# Patient Record
Sex: Female | Born: 1980 | Race: White | Hispanic: Yes | Marital: Married | Smoking: Never smoker
Health system: Southern US, Community
[De-identification: ages and names within clinical notes are randomized; demographics above are authoritative.]

## PROBLEM LIST (undated history)

## (undated) DIAGNOSIS — F99 Mental disorder, not otherwise specified: Secondary | ICD-10-CM

## (undated) DIAGNOSIS — I10 Essential (primary) hypertension: Secondary | ICD-10-CM

## (undated) DIAGNOSIS — D219 Benign neoplasm of connective and other soft tissue, unspecified: Secondary | ICD-10-CM

## (undated) HISTORY — DX: Essential (primary) hypertension: I10

## (undated) HISTORY — DX: Mental disorder, not otherwise specified: F99

## (undated) HISTORY — PX: NO PAST SURGERIES: SHX2092

---
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
## 2011-06-07 DIAGNOSIS — D219 Benign neoplasm of connective and other soft tissue, unspecified: Secondary | ICD-10-CM

## 2011-06-07 HISTORY — DX: Benign neoplasm of connective and other soft tissue, unspecified: D21.9

## 2015-07-30 DIAGNOSIS — E663 Overweight: Secondary | ICD-10-CM | POA: Insufficient documentation

## 2016-07-08 LAB — HM PAP SMEAR: HM Pap smear: NEGATIVE

## 2018-06-18 DIAGNOSIS — N926 Irregular menstruation, unspecified: Secondary | ICD-10-CM | POA: Insufficient documentation

## 2018-12-11 ENCOUNTER — Other Ambulatory Visit: Payer: Self-pay

## 2018-12-11 ENCOUNTER — Ambulatory Visit (LOCAL_COMMUNITY_HEALTH_CENTER): Payer: Self-pay

## 2018-12-11 VITALS — BP 128/80 | Ht 63.0 in | Wt 173.0 lb

## 2018-12-11 DIAGNOSIS — Z3041 Encounter for surveillance of contraceptive pills: Secondary | ICD-10-CM

## 2018-12-11 DIAGNOSIS — Z3009 Encounter for other general counseling and advice on contraception: Secondary | ICD-10-CM

## 2018-12-11 DIAGNOSIS — N926 Irregular menstruation, unspecified: Secondary | ICD-10-CM

## 2018-12-11 DIAGNOSIS — E663 Overweight: Secondary | ICD-10-CM

## 2018-12-11 DIAGNOSIS — Z30011 Encounter for initial prescription of contraceptive pills: Secondary | ICD-10-CM

## 2018-12-11 MED ORDER — NORGESTIMATE-ETH ESTRADIOL 0.25-35 MG-MCG PO TABS: 1.0000 | ORAL_TABLET | Freq: Every day | ORAL | 0 refills | Status: DC

## 2018-12-11 NOTE — Progress Notes (Signed)
   Subjective:    Patient ID: Madeline Grant, female    DOB: March 20, 1981, 38 y.o.   MRN: [id number]  HPI Client is here for restart on Sprintec. Patient's last menstrual period was 12/04/2018 (exact date).    Review of Systems  Constitutional: Negative.   Genitourinary: Negative for menstrual problem, pelvic pain, urgency, vaginal discharge and vaginal pain.  Skin: Negative for rash.  All other systems reviewed and are negative.      Objective:   Physical Exam  Not indicated     Assessment & Plan:  Sprintec take 1 tablet daily x 4 packs.  Co. To use condoms x 1 wk for back-up. Co to contact clinic for appt to complete RV and ocps refill

## 2018-12-11 NOTE — Progress Notes (Signed)
Providers orders completed 

## 2018-12-11 NOTE — Progress Notes (Signed)
Pt to [facility] for more OCP until we are doing physicals (due to Covid 19 precautions). No problems.

## 2019-05-16 ENCOUNTER — Other Ambulatory Visit: Payer: Self-pay

## 2019-05-16 ENCOUNTER — Ambulatory Visit (LOCAL_COMMUNITY_HEALTH_CENTER): Payer: Self-pay

## 2019-05-16 VITALS — BP 126/84 | Ht 63.0 in | Wt 172.0 lb

## 2019-05-16 DIAGNOSIS — Z3041 Encounter for surveillance of contraceptive pills: Secondary | ICD-10-CM

## 2019-05-16 DIAGNOSIS — Z3009 Encounter for other general counseling and advice on contraception: Secondary | ICD-10-CM

## 2019-05-16 MED ORDER — NORGESTIMATE-ETH ESTRADIOL 0.25-35 MG-MCG PO TABS: 1.0000 | ORAL_TABLET | Freq: Every day | ORAL | 0 refills | Status: DC

## 2019-05-16 NOTE — Progress Notes (Signed)
Last physical at [facility] was 09/04/2017. Provider RV in 12/2018.  Per [facility]: 07/12/2016 PAP was NIL & HPV negative (Needs Cotest 07/2021, Last CBE 07/2015). Consulted with provider regarding pt details and request to continue with OCP. Per verbal order by Donnal Moat, CNM, dispensed #2 packs of Sprintec, and pt to schedule physical when opening last pack of OCP ([facility] plans to resume physicals Jan 2021).

## 2019-07-08 ENCOUNTER — Other Ambulatory Visit: Payer: Self-pay | Admitting: Family Medicine

## 2019-07-08 DIAGNOSIS — Z3009 Encounter for other general counseling and advice on contraception: Secondary | ICD-10-CM

## 2019-07-08 MED ORDER — NORGESTIMATE-ETH ESTRADIOL 0.25-35 MG-MCG PO TABS: 1.0000 | ORAL_TABLET | Freq: Every day | ORAL | 0 refills | Status: DC

## 2019-07-08 NOTE — Progress Notes (Signed)
To decrease provider visits during Covid we are prescribing refills of birth control to be dispensed at nurse clinic visits. I will prescribe 1 year of OCP provided pt's BP is wnl, they have not missed any recent pills and they answer No to the following questions at RN clinic visit tomorrow:  Does pt meet any of the following contraindications to estrogen use? -Age ?35 years and smoking ?15 cigarettes per day -Migraine with aura (or migraine without aura + >35 yo) -Two or more RF for cardiovascular disease (such as older age, smoking, diabetes, and hypertension) -Hypertension -Breast cancer -Hx of blood clots or clotting disorders -Known ischemic heart disease -History of stroke -Complicated valvular heart disease (pulmonary HTN, risk for afib, hx subacute bacterial endocarditis) -Liver problems  RN: If answered No to all questions, BP is wnl and they have not missed any recent pills please dispense the pre-prescribed OCP. (If answered Yes please schedule with a provider.)  RN: Please counsel pt about signs and symptoms of DVT and PE as well as increased risk of blood clots in smokers using estrogen containing contraceptives.  RN: Please advise pt of the date to RTC when their next pap will be due.

## 2019-07-10 ENCOUNTER — Telehealth: Payer: Self-pay | Admitting: Family Medicine

## 2019-07-10 NOTE — Telephone Encounter (Signed)
patient missed appt and needs more pills she only has enough for this week

## 2019-07-11 ENCOUNTER — Telehealth: Payer: Self-pay | Admitting: Family Medicine

## 2019-07-12 NOTE — Telephone Encounter (Signed)
Attempted to return call via interpreter M. [place]-no answer, left voicemail message Debera Lat, RN

## 2019-07-15 NOTE — Telephone Encounter (Signed)
ATC with agency interpreter M. [place] No answer, LMTC. Hal Morales, RN

## 2019-07-17 NOTE — Telephone Encounter (Signed)
Pt states she needs appt with provider for birth control pills, needs appt ASAP. Pt scheduled with provider on 07/18/2019.

## 2019-07-18 ENCOUNTER — Other Ambulatory Visit: Payer: Self-pay

## 2019-07-18 ENCOUNTER — Ambulatory Visit (LOCAL_COMMUNITY_HEALTH_CENTER): Payer: Self-pay | Admitting: Family Medicine

## 2019-07-18 ENCOUNTER — Encounter: Payer: Self-pay | Admitting: Family Medicine

## 2019-07-18 VITALS — BP 121/76 | Wt 172.2 lb

## 2019-07-18 DIAGNOSIS — Z3009 Encounter for other general counseling and advice on contraception: Secondary | ICD-10-CM

## 2019-07-18 DIAGNOSIS — Z3041 Encounter for surveillance of contraceptive pills: Secondary | ICD-10-CM

## 2019-07-18 MED ORDER — NORGESTIMATE-ETH ESTRADIOL 0.25-35 MG-MCG PO TABS: 1.0000 | ORAL_TABLET | Freq: Every day | ORAL | 0 refills | Status: DC

## 2019-07-18 NOTE — Progress Notes (Signed)
Patient given only 8 packs Sprintec due to expiration dates. Patient counseled to call for appointment to get more OC when she opens pack #8. Patient states understanding.Jenetta Downer, RN

## 2019-07-18 NOTE — Progress Notes (Addendum)
Patient here for more OC, taking Sprintec. Patient declined interpreter. Was told she needs PE to get more OC. Last PE 09/2017, provider RV in 12/2018. Last Pap per [facility] was 07/22/2016, NIL and HPV negative, next pap due 2023. Given 4 packs Sprintec in 12/2018, and 2 more packs in 05/2019. Needs more, finishes last pack this week. Jenetta Downer, RN

## 2019-07-18 NOTE — Progress Notes (Signed)
Contraception/Family Planning VISIT ENCOUNTER NOTE  Subjective:   Madeline Grant is a 39 y.o. No obstetric history on file. female here for reproductive life counseling.  Desires reliability from [facility].  Likes her current method and it works well for her. No changes in her medical history. Denies clots, liver disease in the alst year. Reports she does not want a pregnancy in the next year. Denies abnormal vaginal bleeding, discharge, pelvic pain, problems with intercourse or other gynecologic concerns.    Gynecologic History Patient's last menstrual period was 07/11/2019 (exact date). Contraception: OCP (estrogen/progesterone)  Health Maintenance Due  Topic Date Due  . HIV Screening  06/21/1994  . TETANUS/TDAP  06/21/1998  . INFLUENZA VACCINE  01/05/2019     The following portions of the patient's history were reviewed and updated as appropriate: allergies, current medications, past family history, past medical history, past social history, past surgical history and problem list.  Review of Systems Pertinent items are noted in HPI.    Objective:  BP 121/76   Wt 172 lb 3.2 oz (78.1 kg)   LMP 07/11/2019 (Exact Date)   BMI 30.50 kg/m  Gen: well appearing, NAD HEENT: no scleral icterus CV: RR Lung: Normal WOB Ext: warm well perfused  PELVIC: not indicated. Pap is up to date (2018)   Assessment and Plan:   Contraception counseling: Reviewed all forms of birth control options in the tiered based approach. available including abstinence; over the counter/barrier methods; hormonal contraceptive medication including pill, patch, ring, injection,contraceptive implant; hormonal and nonhormonal IUDs; permanent sterilization options including vasectomy and the various tubal sterilization modalities. Risks, benefits, and typical effectiveness rates were reviewed.  Questions were answered.  Written information was also given to the patient to review.  Patient desires OCP, this  was prescribed for patient. She will follow up in  1 year for surveillance.  She was told to call with any further questions, or with any concerns about this method of contraception.  Emphasized use of condoms 100% of the time for STI prevention.   1. Family planning  2. Encounter for surveillance of contraceptive pills Dispense Sprintec x 1 year supply Declines STI screening No breast concerns and will follow up with BCCCP for screening mammogram. Handout given to her today .   Please refer to After Visit Summary for other counseling recommendations.   Return in about 1 year (around 07/17/2020) for Yearly wellness exam.  Caren Macadam, MD [place]

## 2020-02-28 ENCOUNTER — Ambulatory Visit: Payer: Self-pay

## 2020-03-04 ENCOUNTER — Ambulatory Visit (LOCAL_COMMUNITY_HEALTH_CENTER): Payer: Self-pay | Admitting: Advanced Practice Midwife

## 2020-03-04 ENCOUNTER — Other Ambulatory Visit: Payer: Self-pay

## 2020-03-04 VITALS — BP 115/74 | Ht 63.0 in | Wt 171.2 lb

## 2020-03-04 DIAGNOSIS — E669 Obesity, unspecified: Secondary | ICD-10-CM | POA: Insufficient documentation

## 2020-03-04 DIAGNOSIS — Z3009 Encounter for other general counseling and advice on contraception: Secondary | ICD-10-CM

## 2020-03-04 DIAGNOSIS — Z3041 Encounter for surveillance of contraceptive pills: Secondary | ICD-10-CM

## 2020-03-04 MED ORDER — NORGESTIMATE-ETH ESTRADIOL 0.25-35 MG-MCG PO TABS: 1.0000 | ORAL_TABLET | Freq: Every day | ORAL | 8 refills | Status: DC

## 2020-03-04 NOTE — Progress Notes (Signed)
Provider ordered #13 packs of OCP's. RN only able to dispense #5 packs due to expiration date of 08/03/2020. Explained to patient to call for an appt to come in and pick up remainder of pills when she starts last pack. Patient verbalized understanding of same. Hal Morales, RN

## 2020-03-04 NOTE — Progress Notes (Signed)
Here today for PE and birth control pill refill. Last PE here was 09/2017, last Pap Smear 07/22/2016, last CBE 07/2015. Declines all STD screening today. Hal Morales, RN

## 2020-03-04 NOTE — Progress Notes (Signed)
[facility] [place] Number: [phone]    Family Planning Visit- Initial Visit  Subjective:  Shanikia Kernodle is a 39 y.o. MHF nonsmoker G3P2103 (15, 21, 23)   being seen today for an initial well woman visit and to discuss family planning options.  She is currently using Combination OCPs for pregnancy prevention. Patient reports she does not want a pregnancy in the next year.  Patient has the following medical conditions has Irregular menstrual bleeding and Obesity BMI=30.3 on their problem list.  Chief Complaint  Patient presents with  . Annual Exam  . Contraception    Patient reports LMP 02/20/20.  Last sex 03/03/20 without condom; with current partner x 24 years.  Took last ocp yesterday; takes >1 hour late 1-2x/mo.  Last ETOH 02/22/20 (1 beer) 1x/mo.  Has never had a mammogram.  Living with husband and 3 kids. Works 37 hrs/wk in a [place]. Last PE 09/2017.  Last CBE 07/2015.  Last pap 07/22/16 neg HPV neg.  Patient denies cigs, MJ  Body mass index is 30.33 kg/m. - Patient is eligible for diabetes screening based on BMI and age >89?  yes HA1C ordered? no  Patient reports 1 of partners in last year. Desires STI screening?  No - refuses  Has patient been screened once for HCV in the past?  No  No results found for: HCVAB  Does the patient have current drug use (including MJ), have a partner with drug use, and/or has been incarcerated since last result? No  If yes-- Screen for HCV through [facility]   Does the patient meet criteria for HBV testing? No  Criteria:  -Household, sexual or needle sharing contact with HBV -History of drug use -HIV positive -Those with known Hep C   Health Maintenance Due  Topic Date Due  . Hepatitis C Screening  Never done  . COVID-19 Vaccine (1) Never done  . HIV Screening  Never done  . TETANUS/TDAP  Never done  . INFLUENZA VACCINE  Never done    Review  of Systems  All other systems reviewed and are negative.   The following portions of the patient's history were reviewed and updated as appropriate: allergies, current medications, past family history, past medical history, past social history, past surgical history and problem list. Problem list updated.   See flowsheet for other program required questions.  Objective:   Vitals:   03/04/20 1607  BP: 115/74  Weight: 171 lb 3.2 oz (77.7 kg)  Height: 5\' 3"  (1.6 m)    Physical Exam Constitutional:      Appearance: Normal appearance. She is obese.  HENT:     Head: Normocephalic and atraumatic.     Mouth/Throat:     Mouth: Mucous membranes are moist.  Eyes:     Conjunctiva/sclera: Conjunctivae normal.  Cardiovascular:     Rate and Rhythm: Normal rate and regular rhythm.  Pulmonary:     Effort: Pulmonary effort is normal.     Breath sounds: Normal breath sounds.  Chest:     Breasts:        Right: Normal.        Left: Normal.  Abdominal:     Palpations: Abdomen is soft.     Comments: Soft without masses or tenderness, poor tone  Genitourinary:    Comments: Pt refuses exam.  Pap not due 07/2021, monogomous relationship, declines sx vaginitis Musculoskeletal:  General: Normal range of motion.     Cervical back: Normal range of motion and neck supple.  Skin:    General: Skin is warm and dry.  Neurological:     Mental Status: She is alert.  Psychiatric:        Mood and Affect: Mood normal.       Assessment and Plan:  Celedonio Miyamoto Nisreen Guise is a 39 y.o. female presenting to the [facility] Department for an initial well woman exam/family planning visit  Contraception counseling: Reviewed all forms of birth control options in the tiered based approach. available including abstinence; over the counter/barrier methods; hormonal contraceptive medication including pill, patch, ring, injection,contraceptive implant, ECP; hormonal and nonhormonal IUDs;  permanent sterilization options including vasectomy and the various tubal sterilization modalities. Risks, benefits, and typical effectiveness rates were reviewed.  Questions were answered.  Written information was also given to the patient to review.  Patient desires ocp's, this was prescribed for patient. She will follow up in 1 year for surveillance.  She was told to call with any further questions, or with any concerns about this method of contraception.  Emphasized use of condoms 100% of the time for STI prevention.  Patient was offered ECP. ECP was not accepted by the patient. ECP counseling was not given - see RN documentation  1. Obesity, unspecified classification, unspecified obesity type, unspecified whether serious comorbidity present   2. Family planning Please give BCCCP # to pt so she can schedule yearly mammograms  3. Encounter birth control May have [place] #13 I po daily to begin when completes current pack l     Return in about 1 year (around 03/04/2021).  No future appointments.  Herbie Saxon, CNM

## 2020-07-16 ENCOUNTER — Other Ambulatory Visit: Payer: Self-pay

## 2020-07-16 ENCOUNTER — Ambulatory Visit (LOCAL_COMMUNITY_HEALTH_CENTER): Payer: Self-pay

## 2020-07-16 VITALS — BP 112/73 | Ht 63.0 in | Wt 166.0 lb

## 2020-07-16 DIAGNOSIS — Z3041 Encounter for surveillance of contraceptive pills: Secondary | ICD-10-CM

## 2020-07-16 DIAGNOSIS — Z3009 Encounter for other general counseling and advice on contraception: Secondary | ICD-10-CM

## 2020-07-16 MED ORDER — NORGESTIMATE-ETH ESTRADIOL 0.25-35 MG-MCG PO TABS: 1.0000 | ORAL_TABLET | Freq: Every day | ORAL | 0 refills | Status: DC

## 2020-07-16 NOTE — Progress Notes (Signed)
On last pack of Sprintec. Denies missing any pills and takes daily at same time. Due for Sprintec #8 packs today.  Sprintec #8 packs dispensed today to complete order by Ola Spurr, CNM dated 03/04/2020. Pt to call [facility] for appt when begins last pack of Sprintec. This pack has been labeled with a reminder. Questions answered and reports understanding. [employer], interpreter today. Josie Saunders, RN

## 2021-02-09 ENCOUNTER — Ambulatory Visit (LOCAL_COMMUNITY_HEALTH_CENTER): Payer: Self-pay

## 2021-02-09 ENCOUNTER — Other Ambulatory Visit: Payer: Self-pay

## 2021-02-09 VITALS — BP 135/79 | Ht 63.0 in | Wt 166.5 lb

## 2021-02-09 DIAGNOSIS — Z3041 Encounter for surveillance of contraceptive pills: Secondary | ICD-10-CM

## 2021-02-09 DIAGNOSIS — Z3009 Encounter for other general counseling and advice on contraception: Secondary | ICD-10-CM

## 2021-02-09 MED ORDER — NORGESTIMATE-ETH ESTRADIOL 0.25-35 MG-MCG PO TABS: 1.0000 | ORAL_TABLET | Freq: Every day | ORAL | 0 refills | Status: DC

## 2021-02-09 NOTE — Progress Notes (Signed)
Consulted by RN re: patient situation.  Reviewed RN note and agree that it reflects our discussion and my recommendations.

## 2021-02-09 NOTE — Progress Notes (Signed)
In Nurse Clinic requesting ocp refill. Pt explains she thought she was having physical today. Pt was scheduled for nurse visit today and no annual physical appts are available. Pt states she is on last week of last pack of Sprintec. Denies missing any pills and takes at same time daily. [place], PA who orders Sprintec #1 pack with instructions to take one pill daily by mouth at same time each day. Sprintec #1 pack dispensed by RN with instructions explained. RN walked pt to clerk and annual PE scheduled for 03/08/2021 at 4 pm. M [place]), interpreter. Josie Saunders, RN

## 2021-03-08 ENCOUNTER — Encounter: Payer: Self-pay | Admitting: Advanced Practice Midwife

## 2021-03-08 ENCOUNTER — Ambulatory Visit (LOCAL_COMMUNITY_HEALTH_CENTER): Payer: Self-pay | Admitting: Advanced Practice Midwife

## 2021-03-08 ENCOUNTER — Other Ambulatory Visit: Payer: Self-pay

## 2021-03-08 VITALS — BP 130/84 | HR 85 | Ht 63.0 in | Wt 168.4 lb

## 2021-03-08 DIAGNOSIS — Z3041 Encounter for surveillance of contraceptive pills: Secondary | ICD-10-CM

## 2021-03-08 DIAGNOSIS — Z3009 Encounter for other general counseling and advice on contraception: Secondary | ICD-10-CM

## 2021-03-08 LAB — WET PREP FOR TRICH, YEAST, CLUE
Trichomonas Exam: NEGATIVE
Yeast Exam: NEGATIVE

## 2021-03-08 MED ORDER — NORETHINDRONE 0.35 MG PO TABS: 1.0000 | ORAL_TABLET | Freq: Every day | ORAL | 13 refills | Status: DC

## 2021-03-08 NOTE — Progress Notes (Signed)
[facility] [place] Number: [phone]    Family Planning Visit- Initial Visit  Subjective:  Madeline Grant is a 40 y.o. MHF nonsmoker G3P2103 (24, 22, 16)  being seen today for an initial annual visit and to discuss contraceptive options.  The patient is currently using Combination OCPs for pregnancy prevention. Patient reports she does not want a pregnancy in the next year.  Patient has the following medical conditions has Irregular menstrual bleeding and Obesity BMI=30.3 on their problem list.  Chief Complaint  Patient presents with   Annual Exam    Patient reports here for yearly physical but c/o h/a onset 3 wks ago and wants to try a different kind of pills. H/a 3-4x/wk usually on forehead or right side of forehead sl relieved with Tylenol but returns. Has apt with [facility] on 03/25/21 to discuss. LMP 02/25/21. Last sex 03/06/21 without condom; with current partner x 25 years; 1 sex partner in last 3 mo. Last ocp last night and compliant (Sprintec). Last dental exam 2021. Living with husband and 2 children. Working 40 hrs/wk. Has not had mammogram even though was given BCCCP # last year. Denies cigs. Last ETOH 2021  Patient denies cigs  Body mass index is 29.83 kg/m. - Patient is eligible for diabetes screening based on BMI and age >55?  yes HA1C ordered? Pt declines bloodwork  Patient reports 1  partner/s in last year. Desires STI screening?  No - declines  Has patient been screened once for HCV in the past?  No  No results found for: HCVAB  Does the patient have current drug use (including MJ), have a partner with drug use, and/or has been incarcerated since last result? No  If yes-- Screen for HCV through [facility]   Does the patient meet criteria for HBV testing? No  Criteria:  -Household, sexual or needle sharing contact with HBV -History of drug use -HIV positive -Those  with known Hep C   Health Maintenance Due  Topic Date Due   COVID-19 Vaccine (1) Never done   HIV Screening  Never done   Hepatitis C Screening  Never done   TETANUS/TDAP  Never done   INFLUENZA VACCINE  Never done    Review of Systems  Neurological:  Positive for headaches (onset 3 wks ago sl relieved with Tylenol but returns always on right side of forehead; has apt [facility]/25/[facility] with [facility]).  All other systems reviewed and are negative.  The following portions of the patient's history were reviewed and updated as appropriate: allergies, current medications, past family history, past medical history, past social history, past surgical history and problem list. Problem list updated.   See flowsheet for other program required questions.  Objective:   Vitals:   03/08/21 1619  BP: 130/84  Pulse: 85  Weight: 168 lb 6.4 oz (76.4 kg)  Height: 5\' 3"  (1.6 m)    Physical Exam Constitutional:      Appearance: Normal appearance. She is obese.  HENT:     Head: Normocephalic and atraumatic.     Mouth/Throat:     Mouth: Mucous membranes are moist.     Comments: Last dental exam 2021 Eyes:     Conjunctiva/sclera: Conjunctivae normal.  Neck:     Thyroid: No thyroid mass, thyromegaly or thyroid tenderness.  Cardiovascular:     Rate and Rhythm: Normal rate and regular rhythm.  Pulmonary:     Effort:  Pulmonary effort is normal.     Breath sounds: Normal breath sounds.  Chest:  Breasts:    Right: Normal.     Left: Normal.     Comments: C/o internal itching around right areola x 2 wks; normal exam Abdominal:     Palpations: Abdomen is soft.     Comments: Soft without masses or tenderness  Genitourinary:    General: Normal vulva.     Exam position: Lithotomy position.     Vagina: Vaginal discharge (white creamy leukorrhea, ph<4.5) present.     Cervix: Normal.     Uterus: Normal.      Adnexa: Right adnexa normal and left adnexa normal.     Rectum: Normal.   Musculoskeletal:        General: Normal range of motion.     Cervical back: Normal range of motion and neck supple.  Skin:    General: Skin is warm and dry.  Neurological:     Mental Status: She is alert.  Psychiatric:        Mood and Affect: Mood normal.      Assessment and Plan:  Madeline Grant is a 40 y.o. female presenting to the [facility] Department for an initial annual wellness/contraceptive visit  Contraception counseling: Reviewed all forms of birth control options in the tiered based approach. available including abstinence; over the counter/barrier methods; hormonal contraceptive medication including pill, patch, ring, injection,contraceptive implant, ECP; hormonal and nonhormonal IUDs; permanent sterilization options including vasectomy and the various tubal sterilization modalities. Risks, benefits, and typical effectiveness rates were reviewed.  Questions were answered.  Written information was also given to the patient to review.  Patient desires change in ocp's, this was prescribed for patient. She will follow up in  1 year for surveillance.  She was told to call with any further questions, or with any concerns about this method of contraception.  Emphasized use of condoms 100% of the time for STI prevention.  Patient was offered ECP. ECP was not accepted by the patient. ECP counseling was not given - see RN documentation  1. Family planning Treat wet mount per standing orders Immunization nurse consult Pt to schedule mammogram apt - WET PREP FOR TRICH, YEAST, CLUE - norethindrone (MICRONOR) 0.35 MG tablet; Take 1 tablet (0.35 mg total) by mouth daily.  Dispense: 28 tablet; Refill: 13  2. Encounter for surveillance of contraceptive pills Micronor #13 I po daily to begin when completes current pack in 1 week     No follow-ups on file.  No future appointments.  Herbie Saxon, CNM

## 2021-03-08 NOTE — Progress Notes (Signed)
Wet mount reviewed - negative. Rich Number, RN

## 2021-06-06 DIAGNOSIS — N63 Unspecified lump in unspecified breast: Secondary | ICD-10-CM

## 2021-06-06 HISTORY — DX: Unspecified lump in unspecified breast: N63.0

## 2021-10-21 ENCOUNTER — Other Ambulatory Visit: Payer: Self-pay

## 2021-10-21 DIAGNOSIS — Z1231 Encounter for screening mammogram for malignant neoplasm of breast: Secondary | ICD-10-CM

## 2021-11-02 ENCOUNTER — Ambulatory Visit: Payer: Self-pay | Attending: Hematology and Oncology | Admitting: *Deleted

## 2021-11-02 ENCOUNTER — Encounter: Payer: Self-pay | Admitting: *Deleted

## 2021-11-02 ENCOUNTER — Ambulatory Visit
Admission: RE | Admit: 2021-11-02 | Discharge: 2021-11-02 | Disposition: A | Discharge status: Home / Self Care | Payer: Self-pay | Source: Ambulatory Visit | Attending: Obstetrics and Gynecology | Admitting: Obstetrics and Gynecology

## 2021-11-02 ENCOUNTER — Encounter (INDEPENDENT_AMBULATORY_CARE_PROVIDER_SITE_OTHER): Payer: Self-pay

## 2021-11-02 VITALS — BP 120/78 | HR 97 | Resp 18 | Wt 175.0 lb

## 2021-11-02 DIAGNOSIS — Z1231 Encounter for screening mammogram for malignant neoplasm of breast: Secondary | ICD-10-CM | POA: Insufficient documentation

## 2021-11-02 DIAGNOSIS — Z01419 Encounter for gynecological examination (general) (routine) without abnormal findings: Secondary | ICD-10-CM

## 2021-11-02 NOTE — Progress Notes (Addendum)
[name] Madeline Grant is a 41 y.o. female who presents to [facility] clinic today with no complaints. Patient presents for her well woman visit.    Pap Smear: Pap not smear completed today per patient request. Offered to complete her pap smear, but patient states she has an appointment with the health department and will discuss with them at her next visit.  Last Pap smear was on 06/21/16 at [facility] clinic and was normal per patient. Per patient has no history of an abnormal Pap smear. Last Pap smear result is not available in Epic.   Physical exam: Breasts Breasts symmetrical. No skin abnormalities bilateral breasts. There is an accessory nipple on the right breast.  No nipple retraction bilateral breasts. No nipple discharge bilateral breasts. No lymphadenopathy. No lumps palpated bilateral breasts.       Pelvic/Bimanual Pap is indicated today but patient refused.  She is to follow up with the [facility] for her pap.   Smoking History: Patient has never smoked.    Patient Navigation: Patient education provided. Access to services provided for patient through [place] [facility] interpreter provided interpretation.   No transportation provided   Colorectal Cancer Screening: Per patient has never had colonoscopy completed No complaints today.    Breast and Cervical Cancer Risk Assessment: Patient does not have family history of breast cancer, known genetic mutations, or radiation treatment to the chest before age 74. Patient does not have history of cervical dysplasia, immunocompromised, or DES exposure in-utero.  Risk Assessment     Risk Scores       11/02/2021   Last edited by: Drue Dun, RN   5-year risk: 0.3 %   Lifetime risk: 5.1 %            A: BCCCP exam without pap smear  P: Referred patient to the [facility] for a screening mammogram. Appointment scheduled for today.  Rico Junker,  RN 11/02/2021 1:20 PM

## 2021-11-04 ENCOUNTER — Other Ambulatory Visit: Payer: Self-pay | Admitting: Obstetrics and Gynecology

## 2021-11-04 DIAGNOSIS — N63 Unspecified lump in unspecified breast: Secondary | ICD-10-CM

## 2021-11-04 DIAGNOSIS — N6320 Unspecified lump in the left breast, unspecified quadrant: Secondary | ICD-10-CM

## 2021-11-04 DIAGNOSIS — R928 Other abnormal and inconclusive findings on diagnostic imaging of breast: Secondary | ICD-10-CM

## 2021-11-24 ENCOUNTER — Ambulatory Visit
Admission: RE | Admit: 2021-11-24 | Discharge: 2021-11-24 | Disposition: A | Discharge status: Home / Self Care | Payer: Self-pay | Source: Ambulatory Visit | Attending: Obstetrics and Gynecology | Admitting: Obstetrics and Gynecology

## 2021-11-24 DIAGNOSIS — R928 Other abnormal and inconclusive findings on diagnostic imaging of breast: Secondary | ICD-10-CM

## 2021-11-24 DIAGNOSIS — N63 Unspecified lump in unspecified breast: Secondary | ICD-10-CM

## 2022-02-08 ENCOUNTER — Ambulatory Visit (LOCAL_COMMUNITY_HEALTH_CENTER): Payer: Self-pay

## 2022-02-08 VITALS — BP 128/66 | Ht 63.0 in | Wt 176.5 lb

## 2022-02-08 DIAGNOSIS — Z3009 Encounter for other general counseling and advice on contraception: Secondary | ICD-10-CM

## 2022-02-08 DIAGNOSIS — Z3041 Encounter for surveillance of contraceptive pills: Secondary | ICD-10-CM

## 2022-02-08 MED ORDER — NORETHINDRONE 0.35 MG PO TABS: 1.0000 | ORAL_TABLET | Freq: Every day | ORAL | 0 refills | Status: DC

## 2022-02-08 NOTE — Progress Notes (Addendum)
In nurse clinic for OC refill.  States this is her last pack of pills and on third week of pills.  Denies any problems and desires to continue.  Last annual exam 03/08/21 and pt reminded she needs to schedule PE for next appt; appt reminder given.    Consulted Dr. Lubertha Sayres and okay to dispense 3 packs of pills. The patient was dispensed Micronor (Norethindrone 0.'35mg'$ ) #3 packs today. I provided counseling today regarding the medication. We discussed the medication, the side effects and when to call clinic. Instructed to take pill daily at same time.  Patient given the opportunity to ask questions. Questions answered.     Jamas Lav [place] provided interpreter services.  Tonny [place], RN

## 2022-05-02 ENCOUNTER — Encounter: Payer: Self-pay | Admitting: Nurse Practitioner

## 2022-05-02 ENCOUNTER — Ambulatory Visit (LOCAL_COMMUNITY_HEALTH_CENTER): Payer: Self-pay | Admitting: Nurse Practitioner

## 2022-05-02 VITALS — BP 139/88 | Ht 63.0 in | Wt 174.0 lb

## 2022-05-02 DIAGNOSIS — Z01419 Encounter for gynecological examination (general) (routine) without abnormal findings: Secondary | ICD-10-CM

## 2022-05-02 DIAGNOSIS — Z3009 Encounter for other general counseling and advice on contraception: Secondary | ICD-10-CM

## 2022-05-02 MED ORDER — NORETHINDRONE 0.35 MG PO TABS: 1.0000 | ORAL_TABLET | Freq: Every day | ORAL | 12 refills | Status: DC

## 2022-05-02 NOTE — Progress Notes (Unsigned)
Pt here for annual physical and birth control.  No in-house labs performed.  Condoms declined.-Forest Becker, RN

## 2022-05-02 NOTE — Progress Notes (Signed)
[facility] Number: [phone]    Family Planning Visit- Initial Visit  Subjective:  Madeline Grant is a 41 y.o.  [phone]   being seen today for an initial annual visit and to discuss reproductive life planning.  The patient is currently using {Upstream End Methods:24109} for pregnancy prevention. Patient reports   {DOES_DOES GYI:94854} want a pregnancy in the next year.     report they are looking for a method that provides {Contraception Wants:27264}  Patient has the following medical conditions has Irregular menstrual bleeding and Obesity BMI=30.3 on their problem list.  Chief Complaint  Patient presents with   Contraception    Physical and birth control pills     Patient reports***  Patient denies ***   Body mass index is 30.82 kg/m. - Patient is eligible for diabetes screening based on BMI and age >58?  {YES/NO/NOT APPLICABLE:20182} OE7O ordered? {YES/NO/NOT APPLICABLE:20182}  Patient reports {NUMBER 1-10:22536}  partner/s in last year. Desires STI screening?  {Yes or If no, why not?:20788}  Has patient been screened once for HCV in the past?  {yes/no:20286}  No results found for: "HCVAB"  Does the patient have current drug use (including MJ), have a partner with drug use, and/or has been incarcerated since last result? {yes/no:20286}  If yes-- Screen for HCV through [facility]   Does the patient meet criteria for HBV testing? {yes/no:20286}  Criteria:  -Household, sexual or needle sharing contact with HBV -History of drug use -HIV positive -Those with known Hep C   Health Maintenance Due  Topic Date Due   COVID-19 Vaccine (1) Never done   HIV Screening  Never done   Hepatitis C Screening  Never done   PAP SMEAR-Modifier  07/08/2021   INFLUENZA VACCINE  Never done    ROS  The following portions of the patient's history were reviewed and updated as  appropriate: allergies, current medications, past family history, past medical history, past social history, past surgical history and problem list. Problem list updated.   See flowsheet for other program required questions.  Objective:   Vitals:   05/02/22 1525  BP: 139/88  Weight: 174 lb (78.9 kg)  Height: '5\' 3"'$  (1.6 m)    Physical Exam    Assessment and Plan:  Madeline Grant is a 41 y.o. female presenting to the [facility] Department for an initial annual wellness/contraceptive visit  Contraception counseling: Reviewed options based on patient desire and reproductive life plan. Patient is interested in {Upstream End Methods:24109}. This {WAS/WAS NOT:[phone]::"was not"} provided to the patient today. *** if not why not clearly documented  Risks, benefits, and typical effectiveness rates were reviewed.  Questions were answered.  Written information was also given to the patient to review.    The patient will follow up in  {NUMBER 1-10:22536} {days/wks/mos/yrs:310907} for surveillance.  The patient was told to call with any further questions, or with any concerns about this method of contraception.  Emphasized use of condoms 100% of the time for STI prevention.  Need for ECP was assessed. Patient reported {unprotected sex categories:26659}.  Reviewed options and patient desired {ECP options:27263}   1. Family planning counseling *** - Hemoglobin A1C - norethindrone (MICRONOR) 0.35 MG tablet; Take 1 tablet (0.35 mg total) by mouth daily.  Dispense: 28 tablet; Refill: 12  2. Well woman exam with routine gynecological exam ***     No follow-ups on file.  No future appointments.  Gregary Cromer, FNP

## 2022-05-03 ENCOUNTER — Encounter: Payer: Self-pay | Admitting: Nurse Practitioner

## 2022-05-03 LAB — HEMOGLOBIN A1C
Est. average glucose Bld gHb Est-mCnc: 120 mg/dL
Hgb A1c MFr Bld: 5.8 % — ABNORMAL HIGH (ref 4.8–5.6)

## 2022-05-04 LAB — IGP, APTIMA HPV
HPV Aptima: NEGATIVE
PAP Smear Comment: 0

## 2022-05-05 ENCOUNTER — Other Ambulatory Visit: Payer: Self-pay | Admitting: *Deleted

## 2022-05-05 DIAGNOSIS — R928 Other abnormal and inconclusive findings on diagnostic imaging of breast: Secondary | ICD-10-CM

## 2022-05-05 NOTE — Addendum Note (Signed)
Addended by: Demetrius Revel on: 05/05/2022 04:23 PM   Modules accepted: Orders

## 2022-05-05 NOTE — Addendum Note (Signed)
Addended by: Demetrius Revel on: 05/05/2022 04:25 PM   Modules accepted: Orders

## 2022-06-09 ENCOUNTER — Ambulatory Visit
Admission: RE | Admit: 2022-06-09 | Discharge: 2022-06-09 | Disposition: A | Discharge status: Home / Self Care | Payer: Self-pay | Source: Ambulatory Visit | Attending: Obstetrics and Gynecology | Admitting: Obstetrics and Gynecology

## 2022-06-09 DIAGNOSIS — R928 Other abnormal and inconclusive findings on diagnostic imaging of breast: Secondary | ICD-10-CM

## 2022-06-10 ENCOUNTER — Other Ambulatory Visit: Payer: Self-pay | Admitting: Obstetrics and Gynecology

## 2022-06-10 DIAGNOSIS — R928 Other abnormal and inconclusive findings on diagnostic imaging of breast: Secondary | ICD-10-CM

## 2022-07-21 ENCOUNTER — Other Ambulatory Visit: Payer: Self-pay

## 2022-07-21 DIAGNOSIS — N6321 Unspecified lump in the left breast, upper outer quadrant: Secondary | ICD-10-CM

## 2022-12-12 ENCOUNTER — Telehealth: Payer: Self-pay

## 2022-12-12 ENCOUNTER — Ambulatory Visit: Payer: Self-pay

## 2022-12-12 NOTE — Telephone Encounter (Signed)
Using [employer], [place], [place]: [id number], I have called and spoke with the pt regarding her missed appointment. Pt states she was not aware of her appt and that she cancelled her mammogram. I have provided the pt with our contact number to reschedule her appointments and have also advised the scheduler as well.

## 2022-12-14 ENCOUNTER — Other Ambulatory Visit: Payer: Self-pay

## 2022-12-20 ENCOUNTER — Ambulatory Visit: Payer: Self-pay | Attending: Hematology and Oncology | Admitting: *Deleted

## 2022-12-20 VITALS — BP 141/88 | Wt 175.0 lb

## 2022-12-20 DIAGNOSIS — Z1239 Encounter for other screening for malignant neoplasm of breast: Secondary | ICD-10-CM

## 2022-12-20 NOTE — Patient Instructions (Signed)
Explained breast self awareness with Madeline Grant. Patient did not need a Pap smear today due to last Pap smear and HPV typing was 05/02/2022. Let her know BCCCP will cover Pap smears and HPV typing every 5 years unless has a history of abnormal Pap smears. Referred patient to the [facility] for a diagnostic mammogram per recommendation. Appointment scheduled Monday, January 30, 2023 at 1520. Patient aware of appointment and will be there. Madeline Grant verbalized understanding.  [name], [name], RN 1:03 PM

## 2022-12-20 NOTE — Progress Notes (Signed)
[name] is a 42 y.o. female who presents to [facility] clinic today with no complaints. Patient had a left breast diagnostic mammogram and ultrasound completed 06/09/2022 that a bilateral diagnostic mammogram and left breast ultrasound was recommended in 6 months for a stable benign appearing left breast mass.    Pap Smear: Pap smear not completed today. Last Pap smear was 05/02/2022 at the [facility] Department clinic and was normal with negative HPV. Per patient has no history of an abnormal Pap smear. Last Pap smear result is available in Epic.   Physical exam: Breasts Breasts symmetrical. No skin abnormalities bilateral breasts. No nipple retraction bilateral breasts. No nipple discharge bilateral breasts. No lymphadenopathy. No lumps palpated bilateral breasts. No complaints of pain or tenderness on exam.      MS DIGITAL DIAG TOMO UNI LEFT  Result Date: 06/09/2022 CLINICAL DATA:  Follow-up of probably benign left breast masses. EXAM: DIGITAL DIAGNOSTIC UNILATERAL LEFT MAMMOGRAM WITH TOMOSYNTHESIS; ULTRASOUND LEFT BREAST LIMITED TECHNIQUE: Left digital diagnostic mammography and breast tomosynthesis was performed.; Targeted ultrasound examination of the left breast was performed. COMPARISON:  Previous exam(s). ACR Breast Density Category c: The breast tissue is heterogeneously dense, which may obscure small masses. FINDINGS: Mammographically, there are no new suspicious masses, areas of architectural distortion or microcalcifications in the left breast. Stable focal asymmetry in the left breast upper outer quadrant, middle depth. Targeted left breast ultrasound is performed and demonstrates stable hypoechoic circumscribed benign-appearing mass at 2:30 o'clock 3 cm from the nipple measuring 1.1 x 0.3 x 1.1 cm. There is an adjacent benign-appearing hypoechoic circumscribed mass measuring 0.7 x 0.2 x 0.9 cm. In the left breast 2:30 o'clock 3 cm from nipple there is a benign cyst  measuring 0.5 x 0.7 x 0.5 cm. IMPRESSION: Stable benign-appearing left breast masses. RECOMMENDATION: Bilateral diagnostic mammogram and focused left breast ultrasound in 6 months. I have discussed the findings and recommendations with the patient. If applicable, a reminder letter will be sent to the patient regarding the next appointment. BI-RADS CATEGORY  3: Probably benign. Electronically Signed   By: Ted Mcalpine M.D.   On: 06/09/2022 16:13  MS DIGITAL DIAG TOMO UNI LEFT  Result Date: 11/24/2021 CLINICAL DATA:  Callback for LEFT breast masses from baseline mammogram. EXAM: DIGITAL DIAGNOSTIC UNILATERAL LEFT MAMMOGRAM WITH TOMOSYNTHESIS AND CAD; ULTRASOUND LEFT BREAST LIMITED TECHNIQUE: Left digital diagnostic mammography and breast tomosynthesis was performed. The images were evaluated with computer-aided detection.; Targeted ultrasound examination of the left breast was performed. COMPARISON:  Previous exam(s). ACR Breast Density Category c: The breast tissue is heterogeneously dense, which may obscure small masses. FINDINGS: Spot compression tomosynthesis views demonstrate persistence of several oval circumscribed masses in the LEFT outer breast at anterior to middle depth. On physical exam, no suspicious mass is appreciated. Targeted ultrasound was performed of the LEFT outer breast. At 3 o'clock 3 cm from the nipple, there is an oval circumscribed anechoic mass with posterior acoustic enhancement. It measures 7 x 7 x 8 mm and is consistent with a benign simple cyst. This corresponds to 1 of the sites of screening mammographic concern. Adjacent at 2:30 3 cm from the nipple, there are 2 adjacent oval circumscribed hypoechoic masses. One spans 13 by 13 x 5 mm while the second spans 7 x 3 by 13 mm. Targeted ultrasound was performed of the LEFT axilla. No suspicious axillary lymph nodes are visualized. IMPRESSION: 1. There are 2 adjacent probably benign masses in the LEFT breast at  2:30 3 cm from the  nipple. These may reflect benign complicated cysts versus benign fibroadenomas. Recommend follow-up mammogram and ultrasound in 6 months. This will establish 6 months of definitive stability from baseline mammogram. 2. There is a benign cyst at the additional site of screening mammographic concern. RECOMMENDATION: LEFT diagnostic mammogram with ultrasound in 6 months. I have discussed the findings and recommendations with the patient in Spanish with the assistance of a Spanish interpreter. If applicable, a reminder letter will be sent to the patient regarding the next appointment. BI-RADS CATEGORY  3: Probably benign. Electronically Signed   By: Meda Klinefelter M.D.   On: 11/24/2021 16:04  MS DIGITAL SCREENING TOMO BILATERAL  Result Date: 11/03/2021 CLINICAL DATA:  Screening. EXAM: DIGITAL SCREENING BILATERAL MAMMOGRAM WITH TOMOSYNTHESIS AND CAD TECHNIQUE: Bilateral screening digital craniocaudal and mediolateral oblique mammograms were obtained. Bilateral screening digital breast tomosynthesis was performed. The images were evaluated with computer-aided detection. COMPARISON:  None. ACR Breast Density Category c: The breast tissue is heterogeneously dense, which may obscure small masses. FINDINGS: In the left breast, a possible mass warrants further evaluation. In the right breast, no findings suspicious for malignancy. IMPRESSION: Further evaluation is suggested for a possible mass in the left breast. RECOMMENDATION: Diagnostic mammogram and possibly ultrasound of the left breast. (Code:[id number]) The patient will be contacted regarding the findings, and additional imaging will be scheduled. BI-RADS CATEGORY  0: Incomplete. Need additional imaging evaluation and/or prior mammograms for comparison. Electronically Signed   By: Edwin Cap M.D.   On: 11/03/2021 16:40    Pelvic/Bimanual Pap is not indicated today per BCCCP guidelines.   Smoking History: Patient has never smoked.   Patient  Navigation: Patient education provided. Access to services provided for patient through [employer] program. Spanish interpreter Lowella Fairy from [facility] provided.    Breast and Cervical Cancer Risk Assessment: Patient does not have family history of breast cancer, known genetic mutations, or radiation treatment to the chest before age 12. Patient does not have history of cervical dysplasia, immunocompromised, or DES exposure in-utero.  Risk Scores as of Encounter on 12/20/2022     Dondra Spry           5-year 0.51%   Lifetime 7.14%   This patient is [place]/Latina but has no documented birth country, so the [employer] model used data from [place] patients to calculate their risk score. Document a birth country in the Demographics activity for a more accurate score.         Last calculated by Narda Rutherford, LPN on 0/98/1191 at 12:51 PM        A: BCCCP exam without pap smear No complaints.   P: Referred patient to the [facility] for a diagnostic mammogram per recommendation. Appointment scheduled Monday, January 30, 2023 at 1520.  Priscille Heidelberg, RN 12/20/2022 1:03 PM

## 2023-01-06 ENCOUNTER — Other Ambulatory Visit: Payer: Self-pay

## 2023-01-30 ENCOUNTER — Ambulatory Visit
Admission: RE | Admit: 2023-01-30 | Discharge: 2023-01-30 | Disposition: A | Discharge status: Home / Self Care | Payer: Self-pay | Source: Ambulatory Visit | Attending: Obstetrics and Gynecology | Admitting: Obstetrics and Gynecology

## 2023-01-30 DIAGNOSIS — N6321 Unspecified lump in the left breast, upper outer quadrant: Secondary | ICD-10-CM

## 2023-02-01 ENCOUNTER — Other Ambulatory Visit: Payer: Self-pay | Admitting: Obstetrics and Gynecology

## 2023-02-01 DIAGNOSIS — R928 Other abnormal and inconclusive findings on diagnostic imaging of breast: Secondary | ICD-10-CM

## 2023-05-08 ENCOUNTER — Encounter: Payer: Self-pay | Admitting: Nurse Practitioner

## 2023-05-08 ENCOUNTER — Ambulatory Visit: Payer: Self-pay | Admitting: Nurse Practitioner

## 2023-05-08 VITALS — BP 140/86 | Ht 63.0 in | Wt 172.0 lb

## 2023-05-08 DIAGNOSIS — N6320 Unspecified lump in the left breast, unspecified quadrant: Secondary | ICD-10-CM

## 2023-05-08 DIAGNOSIS — Z3009 Encounter for other general counseling and advice on contraception: Secondary | ICD-10-CM

## 2023-05-08 DIAGNOSIS — Z01419 Encounter for gynecological examination (general) (routine) without abnormal findings: Secondary | ICD-10-CM

## 2023-05-08 DIAGNOSIS — Z30011 Encounter for initial prescription of contraceptive pills: Secondary | ICD-10-CM

## 2023-05-09 NOTE — Progress Notes (Signed)
Pt is here for family planning visit.  Family planning packet reviewed and given to pt.   Condoms declined. The patient was dispensed micronorx13 today. I provided counseling today regarding the medication. We discussed the medication, the side effects and when to call clinic. Patient given the opportunity to ask questions. Questions answered.   Gaspar Garbe, RN

## 2023-05-14 ENCOUNTER — Encounter: Payer: Self-pay | Admitting: Nurse Practitioner

## 2023-05-14 MED ORDER — NORETHINDRONE 0.35 MG PO TABS: 1.0000 | ORAL_TABLET | Freq: Every day | ORAL | Status: DC

## 2023-05-14 NOTE — Progress Notes (Signed)
[facility] DEPARTMENT [facility] [address] Main Number: [phone]  Family Planning Visit- Repeat Yearly Visit  Subjective:  Madeline Grant is a 42 y.o. [phone]  being seen today for an annual wellness visit and to discuss contraception options.   The patient is currently using Oral Contraceptive for pregnancy prevention. Patient does not want a pregnancy in the next year.   They report they are looking for a method that provides Does not want something inserted and Method they can control starting/stopping   Patient has the following medical problems: has Irregular menstrual bleeding and Obesity BMI=30.3 on their problem list.  Chief Complaint  Patient presents with   Annual Exam    And ocp     Patient reports only concern today is a small breast mass to the upper left quadrant of the left breast, which has been present for over a year. Patient reports taking oral contraceptive pills daily and would like a refill today.  She indicated monthly periods with LMP 04-19-23 (19 days prior to this visit). Last sexual encounter was 05/05/23 (3 days prior) without a condom.  Last PAP 05/02/22: NILM, HPV (-). Next due 04/2027. Patient reports vaginal sex with one female partner without the use of condoms. Denies history of STI.   Patient denies symptoms of STI.    See flowsheet for other program required questions.   Body mass index is 30.47 kg/m. - Patient is eligible for diabetes screening based on BMI> 25 and age >35?  yes HA1C ordered? No-Oversight by clinician.   Patient reports 1 of partners in last year. Desires STI screening?  No - patient declined.    Has patient been screened once for HCV in the past?  No  No results found for: "HCVAB"  Does the patient have current of drug use, have a partner with drug use, and/or has been incarcerated since last result? No  If yes-- Screen for HCV through [facility]   Does the  patient meet criteria for HBV testing? No  Criteria:  -Household, sexual or needle sharing contact with HBV -History of drug use -HIV positive -Those with known Hep C   Health Maintenance Due  Topic Date Due   HIV Screening  Never done   Hepatitis C Screening  Never done   DTaP/Tdap/Td (1 - Tdap) Never done   INFLUENZA VACCINE  Never done   COVID-19 Vaccine (1 - 2023-24 season) Never done    Review of Systems  Constitutional:        Breast lump. Followed by BCCCP.   All other systems reviewed and are negative.   The following portions of the patient's history were reviewed and updated as appropriate: allergies, current medications, past family history, past medical history, past social history, past surgical history and problem list. Problem list updated.  Objective:   Vitals:   05/08/23 1601  Weight: 172 lb (78 kg)  Height: 5\' 3"  (1.6 m)    Physical Exam Vitals and nursing note reviewed. Exam conducted with a chaperone present Roddie Mc Yemen (Spanish interpreter) present as Biomedical engineer.).  Constitutional:      Appearance: Normal appearance.  HENT:     Head: Normocephalic.     Salivary Glands: Right salivary gland is not diffusely enlarged or tender. Left salivary gland is not diffusely enlarged or tender.     Mouth/Throat:     Lips: Pink. No lesions.     Mouth: Mucous membranes are moist.  Tongue: No lesions. Tongue does not deviate from midline.     Pharynx: Oropharynx is clear. Uvula midline. No oropharyngeal exudate or posterior oropharyngeal erythema.     Tonsils: No tonsillar exudate.  Eyes:     General:        Right eye: No discharge.        Left eye: No discharge.  Cardiovascular:     Rate and Rhythm: Normal rate and regular rhythm.     Heart sounds: Normal heart sounds, S1 normal and S2 normal.  Pulmonary:     Effort: Pulmonary effort is normal.     Breath sounds: Normal breath sounds.  Chest:  Breasts:    Tanner Score is 5.     Breasts are  symmetrical.     Right: Normal. No swelling, bleeding, inverted nipple, mass, nipple discharge, skin change or tenderness.     Left: Mass present. No swelling, bleeding, inverted nipple, nipple discharge, skin change or tenderness.       Comments: About a 1cm round, fixed nodule felt in upper left quadrant of left breast.  Abdominal:     General: Abdomen is flat. Bowel sounds are normal. There is no distension.     Palpations: Abdomen is soft.     Tenderness: There is no abdominal tenderness. There is no guarding or rebound.  Genitourinary:    General: Normal vulva.     Exam position: Lithotomy position.     Pubic Area: No rash or pubic lice.      Tanner stage (genital): 5.     Labia:        Right: No rash, tenderness, lesion or injury.        Left: No rash, tenderness, lesion or injury.      Vagina: Normal. No vaginal discharge.     Cervix: Normal.     Uterus: Normal.      Adnexa: Right adnexa normal and left adnexa normal.  Lymphadenopathy:     Head:     Right side of head: No submental, submandibular, tonsillar, preauricular or posterior auricular adenopathy.     Left side of head: No submental, submandibular, tonsillar, preauricular or posterior auricular adenopathy.     Cervical: No cervical adenopathy.     Right cervical: No superficial or posterior cervical adenopathy.    Left cervical: No superficial or posterior cervical adenopathy.     Upper Body:     Right upper body: No supraclavicular or axillary adenopathy.     Left upper body: No supraclavicular or axillary adenopathy.     Lower Body: No right inguinal adenopathy. No left inguinal adenopathy.  Skin:    General: Skin is warm and dry.     Comments: Skin tone appropriate for ethnicity.   Neurological:     Mental Status: She is alert and oriented to person, place, and time.  Psychiatric:        Attention and Perception: Attention normal.        Mood and Affect: Mood and affect normal.        Speech: Speech normal.         Behavior: Behavior normal. Behavior is cooperative.        Thought Content: Thought content normal.    Assessment and Plan:  Madeline Grant is a 42 y.o. female [phone] presenting to the [facility] Department for an yearly wellness and contraception visit  1. Well woman exam Patient reports having a PCP that manages her BP with  medications. Patient encouraged to follow-up with PCP soon for a bp check as it was elevated in office today. Patient reported understanding and agreed with plan.  CBE perfomed today. Would suggest CBE at next visit with patient history of breast mass.  Patient declined STI vaginal swabs and serum testing today.   2. Family planning  Contraception counseling: Reviewed options based on patient desire and reproductive life plan. Patient is interested in Oral Contraceptive. This was provided to the patient today.   Risks, benefits, and typical effectiveness rates were reviewed.  Questions were answered.  Written information was also given to the patient to review.    The patient will follow up in  1 years for surveillance.  The patient was told to call with any further questions, or with any concerns about this method of contraception.  Emphasized use of condoms 100% of the time for STI prevention.  Educated on ECP and assessed need for ECP. Patient was NOT offered ECP based on no unprotected sex.   - norethindrone (MICRONOR) 0.35 MG tablet; Take 1 tablet (0.35 mg total) by mouth daily.  3. Mass of left breast, unspecified quadrant Patient followed by BCCCP with bi-annual breast imaging. Patient reports the last images were obtained 4 months ago and she was told area of concern was stable and to return for another scan in 1 year. Patient encouraged to make sure she keeps those appointments. Patient verbalized understanding.   Return in about 1 year (around 05/07/2024) for Annual well woman exam.  No future appointments.  Due to  language barrier, an interpreter Roddie Mc Yemen) was present during the history-taking and subsequent discussion (and for part of the physical exam) with this patient.  Edmonia James, NP

## 2023-10-17 IMAGING — MG MM DIGITAL DIAGNOSTIC UNILAT*L* W/ TOMO W/ CAD
4 series · 4 of 12 positions shown · non-contrast
Comparison: Previous exam(s).

CLINICAL DATA: Callback for LEFT breast masses from baseline
mammogram.

EXAM:
DIGITAL DIAGNOSTIC UNILATERAL LEFT MAMMOGRAM WITH TOMOSYNTHESIS AND
CAD; ULTRASOUND LEFT BREAST LIMITED
TECHNIQUE: Left digital diagnostic mammography and breast tomosynthesis was
performed. The images were evaluated with computer-aided detection.;
Targeted ultrasound examination of the left breast was performed.

[L CC synth-2D]
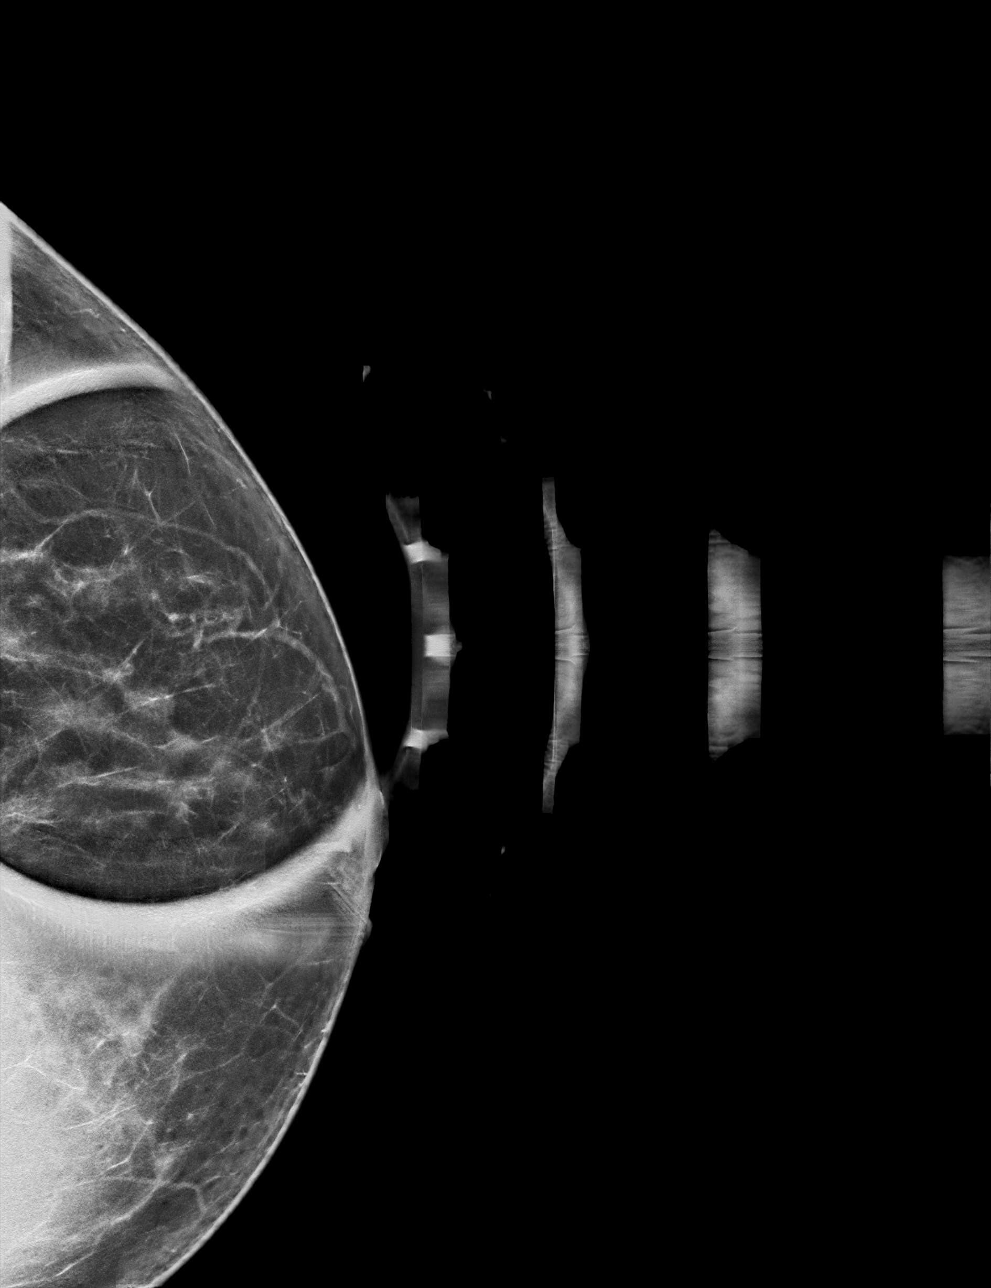

[L MLO synth-2D]
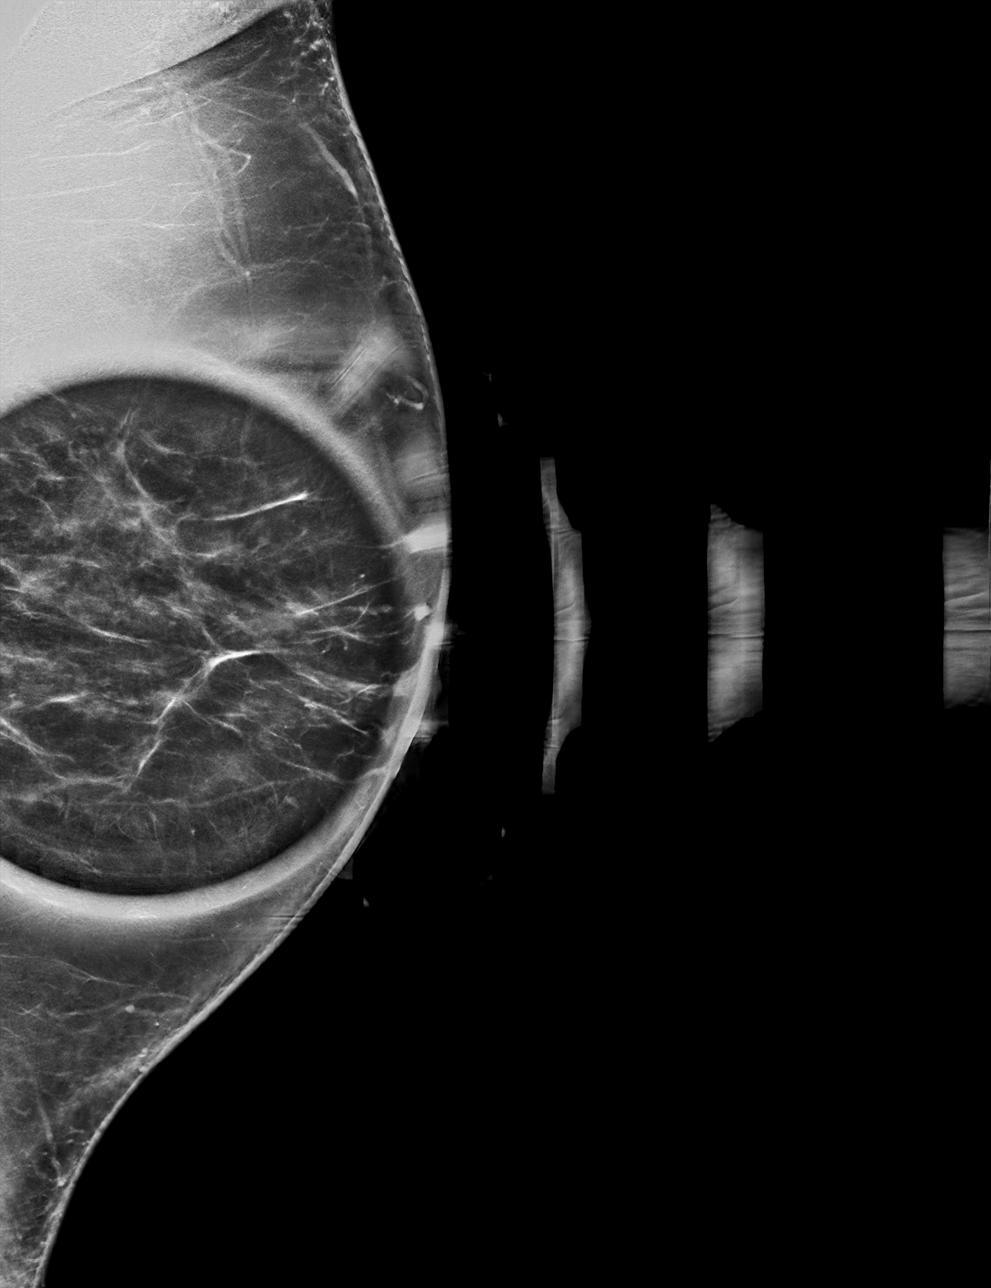

[L MLO tomo · tomo slice 37/74.0]
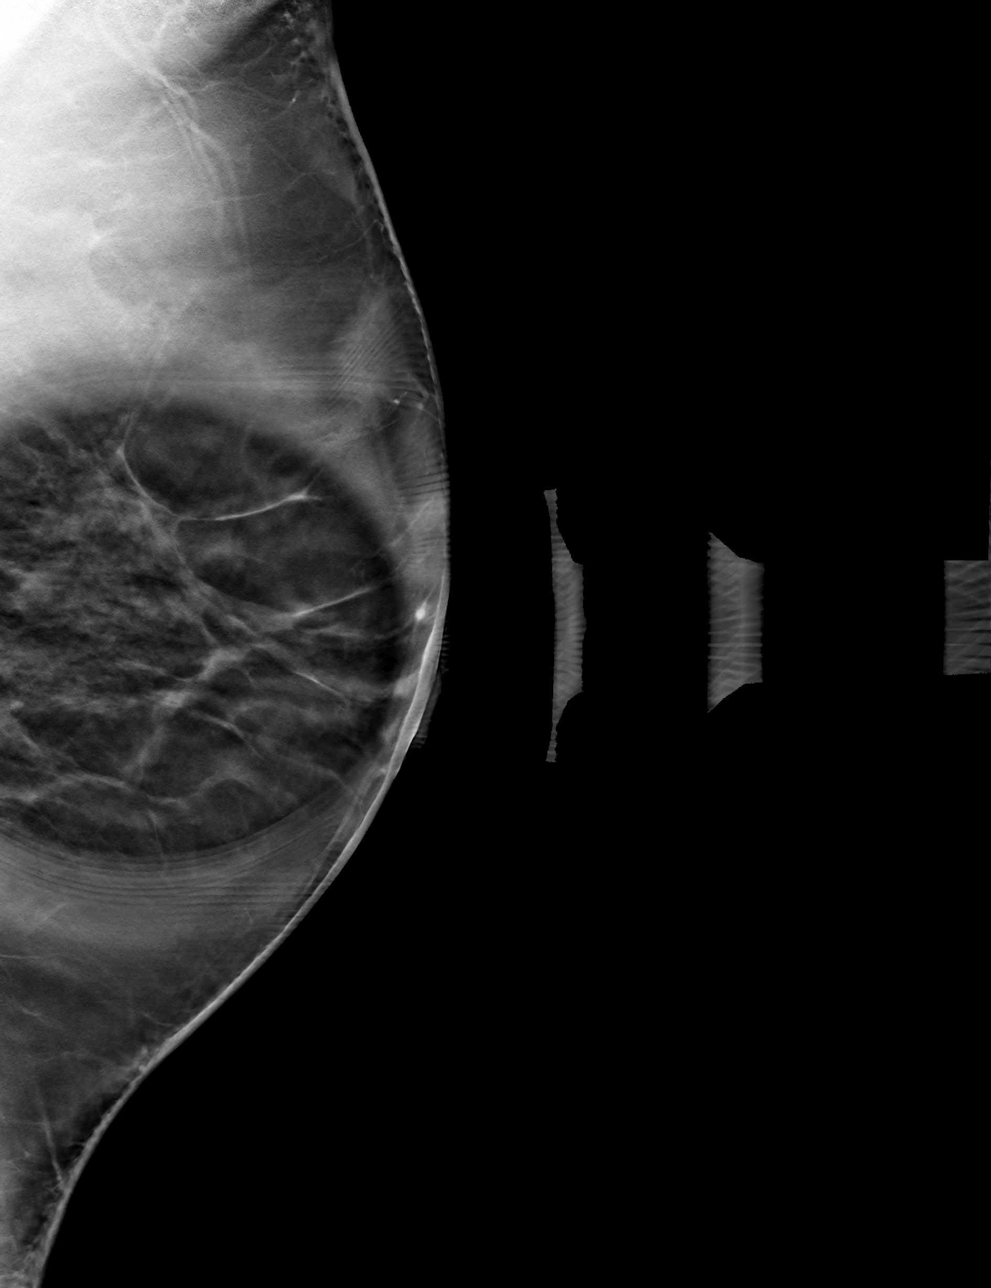

[L CC tomo · tomo slice 37/72.0]
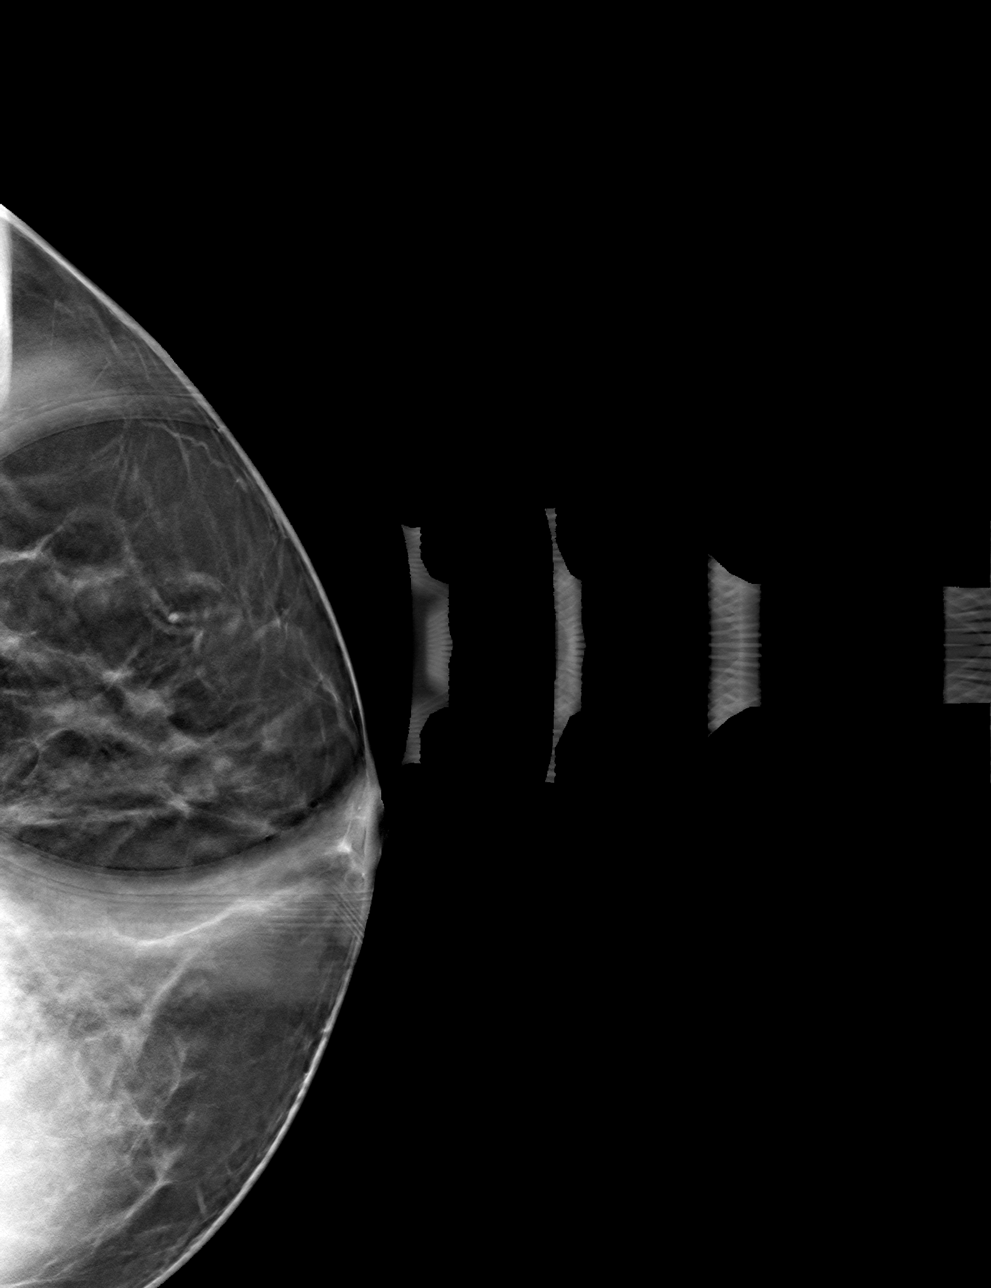

[4 of 12 positions shown; findings below may reference images not displayed]

ACR Breast Density Category c: The breast tissue is heterogeneously
dense, which may obscure small masses.
FINDINGS: Spot compression tomosynthesis views demonstrate persistence of
several oval circumscribed masses in the LEFT outer breast at
anterior to middle depth.

On physical exam, no suspicious mass is appreciated.

Targeted ultrasound was performed of the LEFT outer breast. At 3
o'clock 3 cm from the nipple, there is an oval circumscribed
anechoic mass with posterior acoustic enhancement. It measures 7 x 7
x 8 mm and is consistent with a benign simple cyst. This corresponds
to 1 of the sites of screening mammographic concern.

Adjacent at [DATE] 3 cm from the nipple, there are 2 adjacent oval
circumscribed hypoechoic masses. One spans 13 by 13 x 5 mm while the
second spans 7 x 3 by 13 mm.

Targeted ultrasound was performed of the LEFT axilla. No suspicious
axillary lymph nodes are visualized.
IMPRESSION: 1. There are 2 adjacent probably benign masses in the LEFT breast at
[DATE] 3 cm from the nipple. These may reflect benign complicated
cysts versus benign fibroadenomas. Recommend follow-up mammogram and
ultrasound in 6 months. This will establish 6 months of definitive
stability from baseline mammogram.
2. There is a benign cyst at the additional site of screening
mammographic concern.

RECOMMENDATION:
LEFT diagnostic mammogram with ultrasound in 6 months.

I have discussed the findings and recommendations with the patient
in Spanish with the assistance of a Spanish interpreter. If
applicable, a reminder letter will be sent to the patient regarding
the next appointment.

BI-RADS CATEGORY  3: Probably benign.

## 2023-10-17 IMAGING — US US BREAST*L* LIMITED INC AXILLA
1 series · 13 of 19 positions shown · non-contrast
Comparison: Previous exam(s).

CLINICAL DATA: Callback for LEFT breast masses from baseline
mammogram.

EXAM:
DIGITAL DIAGNOSTIC UNILATERAL LEFT MAMMOGRAM WITH TOMOSYNTHESIS AND
CAD; ULTRASOUND LEFT BREAST LIMITED
TECHNIQUE: Left digital diagnostic mammography and breast tomosynthesis was
performed. The images were evaluated with computer-aided detection.;
Targeted ultrasound examination of the left breast was performed.

[Series 1: us breast*left* limited inc axilla · 0.07mm/px · 13 of 19 slices shown]
[im 1/19]
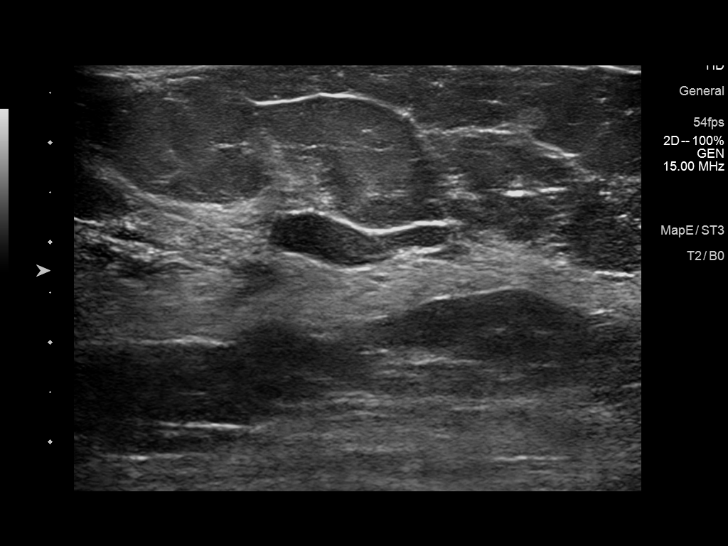
[im 3/19]
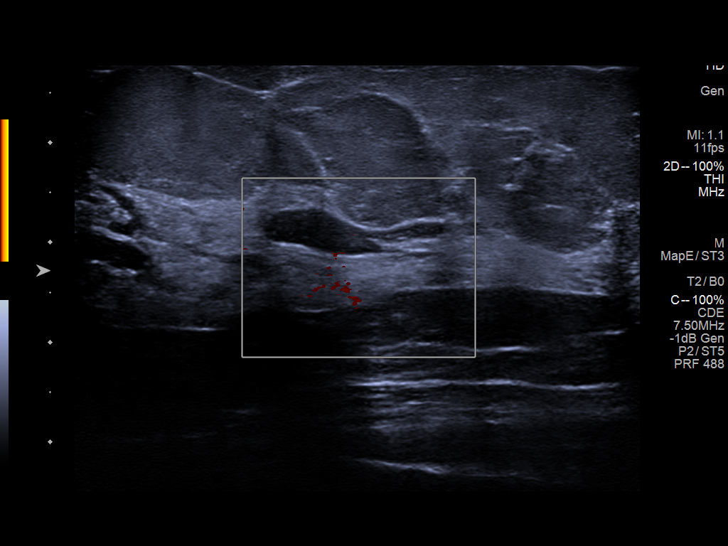
[im 4/19]
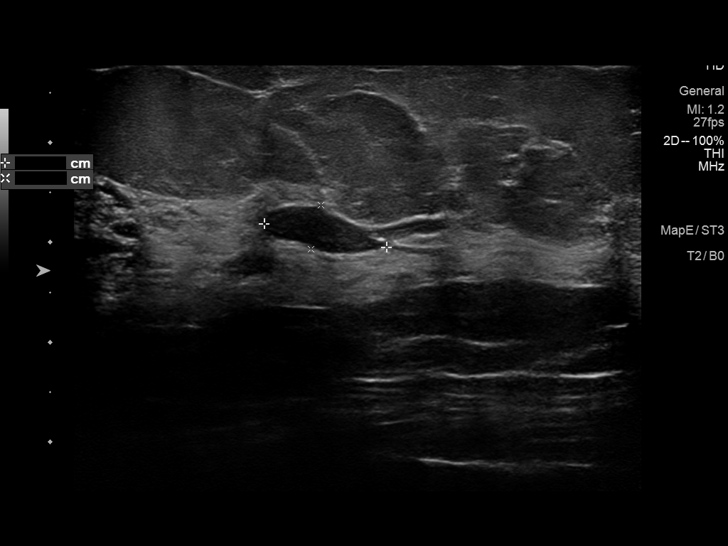
[im 6/19]
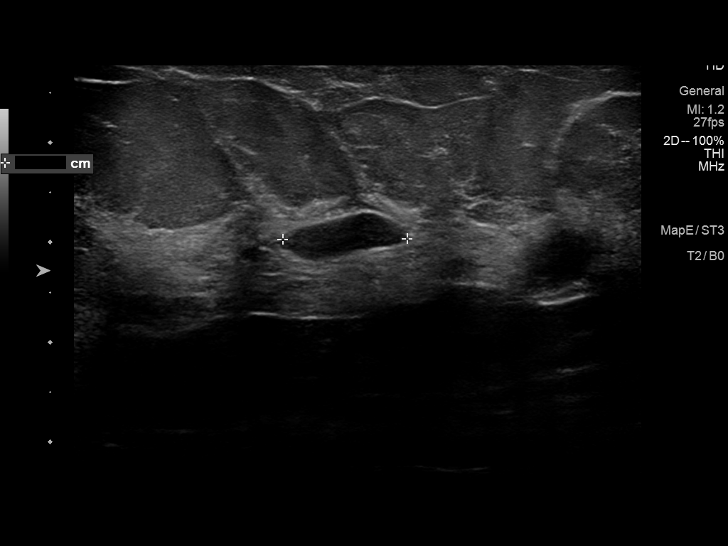
[im 7/19]
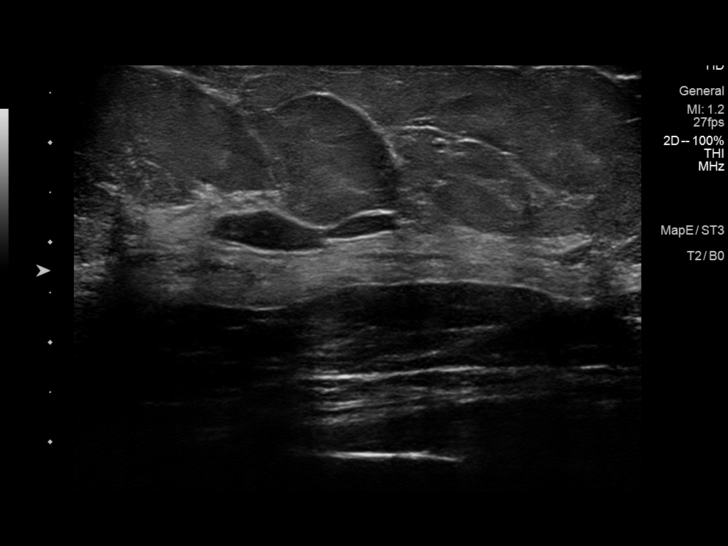
[im 9/19]
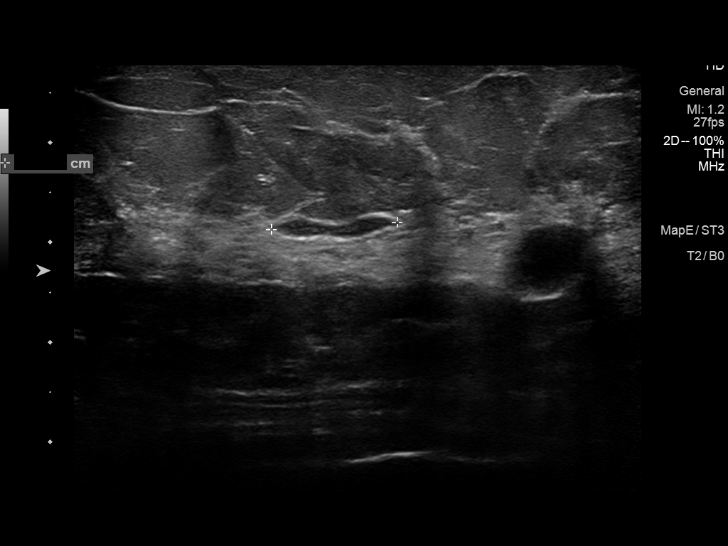
[im 10/19]
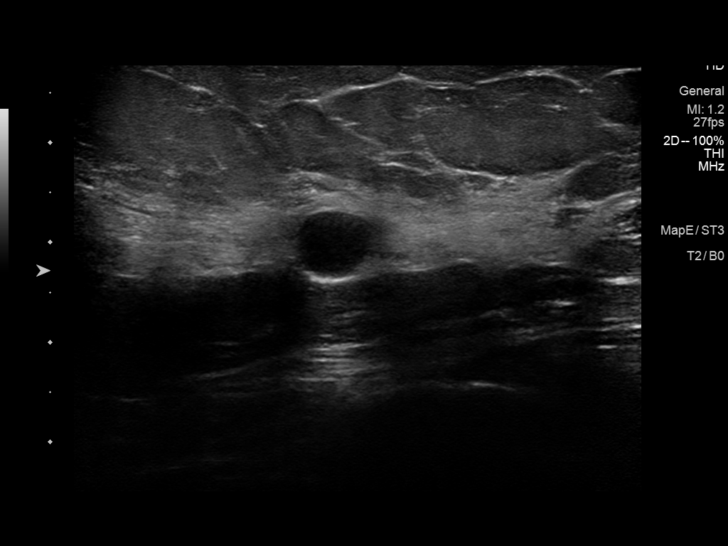
[im 11/19]
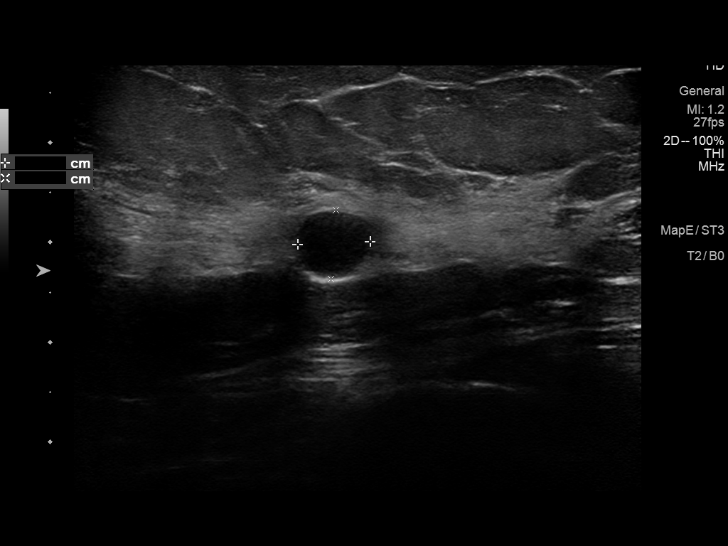
[im 13/19]
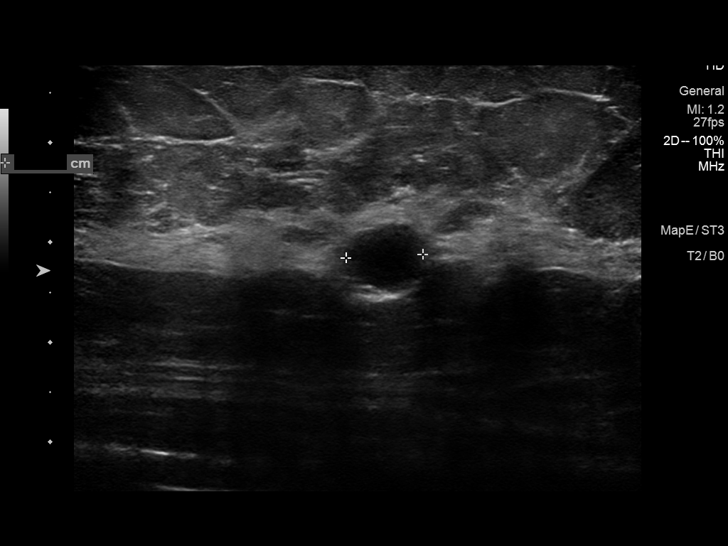
[im 14/19]
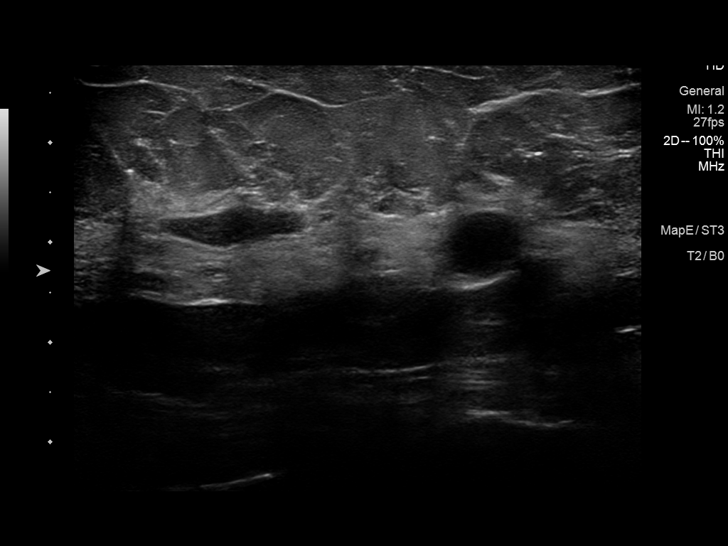
[im 16/19]
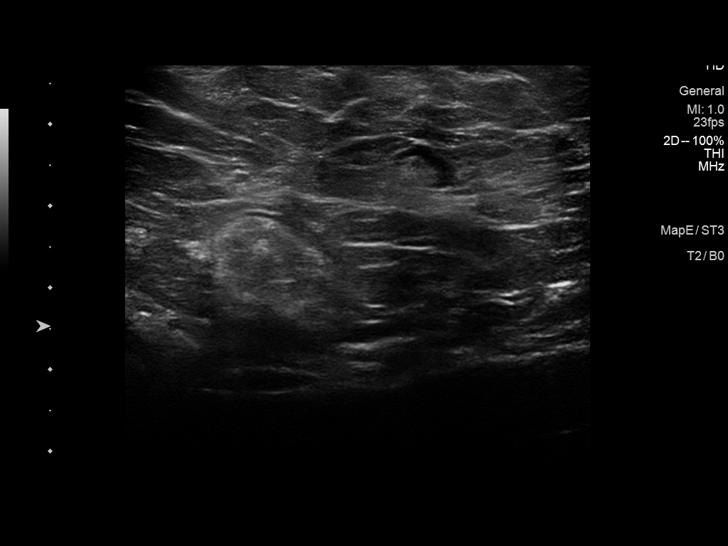
[im 17/19]
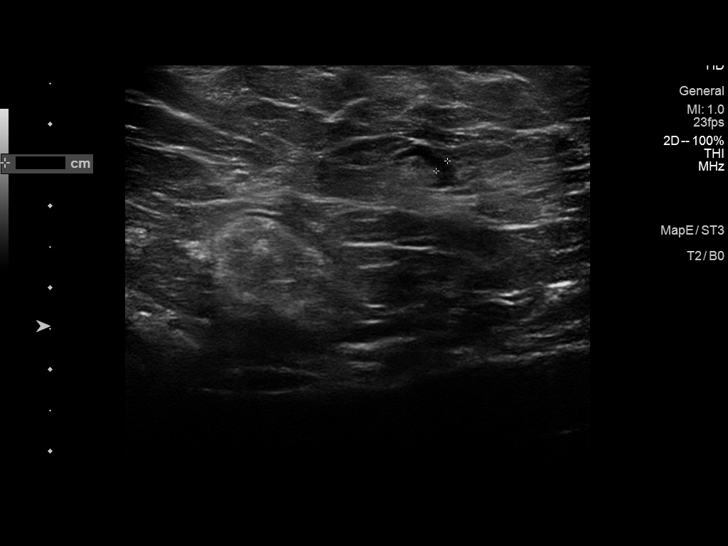
[im 19/19]
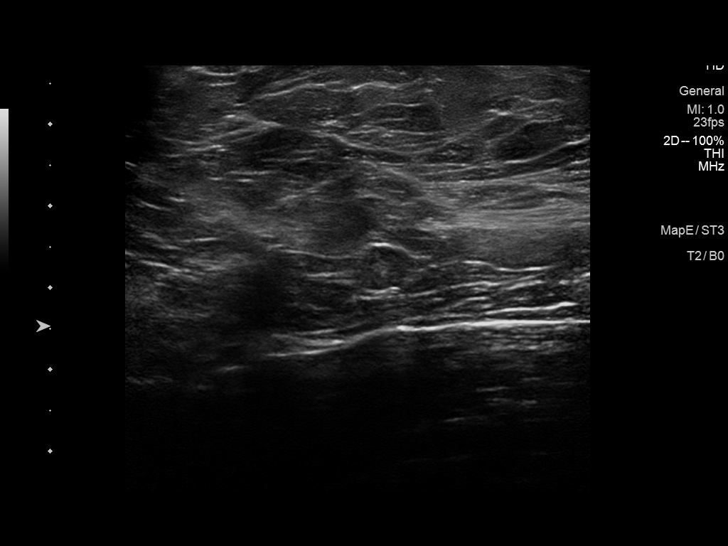

[13 of 19 positions shown; findings below may reference images not displayed]

ACR Breast Density Category c: The breast tissue is heterogeneously
dense, which may obscure small masses.
FINDINGS: Spot compression tomosynthesis views demonstrate persistence of
several oval circumscribed masses in the LEFT outer breast at
anterior to middle depth.

On physical exam, no suspicious mass is appreciated.

Targeted ultrasound was performed of the LEFT outer breast. At 3
o'clock 3 cm from the nipple, there is an oval circumscribed
anechoic mass with posterior acoustic enhancement. It measures 7 x 7
x 8 mm and is consistent with a benign simple cyst. This corresponds
to 1 of the sites of screening mammographic concern.

Adjacent at [DATE] 3 cm from the nipple, there are 2 adjacent oval
circumscribed hypoechoic masses. One spans 13 by 13 x 5 mm while the
second spans 7 x 3 by 13 mm.

Targeted ultrasound was performed of the LEFT axilla. No suspicious
axillary lymph nodes are visualized.
IMPRESSION: 1. There are 2 adjacent probably benign masses in the LEFT breast at
[DATE] 3 cm from the nipple. These may reflect benign complicated
cysts versus benign fibroadenomas. Recommend follow-up mammogram and
ultrasound in 6 months. This will establish 6 months of definitive
stability from baseline mammogram.
2. There is a benign cyst at the additional site of screening
mammographic concern.

RECOMMENDATION:
LEFT diagnostic mammogram with ultrasound in 6 months.

I have discussed the findings and recommendations with the patient
in Spanish with the assistance of a Spanish interpreter. If
applicable, a reminder letter will be sent to the patient regarding
the next appointment.

BI-RADS CATEGORY  3: Probably benign.

## 2023-11-09 NOTE — Congregational Nurse Program (Signed)
  Dept: [phone]   Congregational Nurse Program Note  Date of Encounter: 11/09/2023  Past Medical History: Past Medical History:  Diagnosis Date   Breast lump in female 2023   pt unsure which breast   Hypertension    Mental disorder    Depression   Myoma 2013   diagnosed in [place]    Encounter Details:  Community Questionnaire - 11/09/23 1701       Questionnaire   Ask client: Do you give verbal consent for me to treat you today? Yes    Student Assistance N/A    Location Patient Served  [place]    Encounter Setting CN site    Population Status Unknown    Insurance Uninsured ([employer]s/Self-Pay/Medicaid Family Planning)    Insurance/Financial Assistance Referral N/A    Medication N/A    Medical Provider Yes    Screening Referrals Made N/A    Medical Referrals Made N/A    Medical Appointment Completed N/A    CNP Interventions Educate    Screenings CN Performed Blood Pressure;Blood Glucose    ED Visit Averted N/A    Life-Saving Intervention Made N/A             Today's Vitals   11/09/23 1700  BP: (!) 140/80   There is no height or weight on file to calculate BMI.   Educated on BP abnormal/ normal values.  Educated on food options/ choices with lower sodium.  Gave BP card to keep up with weekly BP, medicaid information and [facility] information. Patient agreed to come back to have her BP monitored.  Cricket Doll, RN

## 2023-12-29 ENCOUNTER — Other Ambulatory Visit: Payer: Self-pay

## 2023-12-29 DIAGNOSIS — N6321 Unspecified lump in the left breast, upper outer quadrant: Secondary | ICD-10-CM

## 2024-02-12 ENCOUNTER — Ambulatory Visit: Payer: Self-pay | Attending: Obstetrics and Gynecology | Admitting: *Deleted

## 2024-02-12 ENCOUNTER — Ambulatory Visit
Admission: RE | Admit: 2024-02-12 | Discharge: 2024-02-12 | Disposition: A | Discharge status: Home / Self Care | Payer: Self-pay | Source: Ambulatory Visit | Attending: Obstetrics and Gynecology | Admitting: Obstetrics and Gynecology

## 2024-02-12 VITALS — BP 139/88 | Wt 167.0 lb

## 2024-02-12 DIAGNOSIS — N6321 Unspecified lump in the left breast, upper outer quadrant: Secondary | ICD-10-CM

## 2024-02-12 DIAGNOSIS — Z1239 Encounter for other screening for malignant neoplasm of breast: Secondary | ICD-10-CM

## 2024-02-12 NOTE — Patient Instructions (Signed)
 Explained breast self awareness with Madeline Grant Dedra Jimmie Jimmie. Patient did not need a Pap smear today due to last Pap smear and HPV typing was 05/02/2022. Let her know BCCCP will cover Pap smears and HPV typing every 5 years unless has a history of abnormal Pap smears. Referred patient to the [facility] for a diagnostic mammogram. Appointment scheduled Monday, February 12, 2024 at 1340. Patient aware of appointment and will be there. Madeline Grant [place] verbalized understanding.  Jenetta Wease, Wanda Ship, RN 12:29 PM

## 2024-02-12 NOTE — Progress Notes (Signed)
 [name] is a 43 y.o. female who presents to [facility] clinic today with no complaints. Patients last diagnostic mammogram was completed 06/09/2022 and a 1-month left breast diagnostic mammogram is recommended for follow up.   Pap Smear: Pap smear not completed today. Last Pap smear was 05/02/2022 at the [facility] clinic and was normal with negative HPV. Per patient has no history of an abnormal Pap smear. Last Pap smear result is available in Epic.    Physical exam: Breasts Breasts symmetrical. No skin abnormalities bilateral breasts. No nipple retraction bilateral breasts. No nipple discharge bilateral breasts. No lymphadenopathy. No lumps palpated bilateral breasts. No complaints of pain or tenderness on exam.       MS DIGITAL DIAG TOMO BILAT Result Date: 01/30/2023 CLINICAL DATA:  43 year old female presenting for follow-up of likely benign left breast masses. EXAM: DIGITAL DIAGNOSTIC BILATERAL MAMMOGRAM WITH TOMOSYNTHESIS AND CAD; ULTRASOUND LEFT BREAST LIMITED TECHNIQUE: Bilateral digital diagnostic mammography and breast tomosynthesis was performed. The images were evaluated with computer-aided detection. ; Targeted ultrasound examination of the left breast was performed. COMPARISON:  Previous exam(s). ACR Breast Density Category c: The breasts are heterogeneously dense, which may obscure small masses. FINDINGS: No suspicious calcifications, masses or areas of distortion are seen in the bilateral breasts. The asymmetry in the upper-outer left breast appears overall stable. Ultrasound of the left breast at 2:30, 3 cm from the nipple demonstrates a stable oval hypoechoic mass measuring 1.0 x 0.4 x 0.9 cm, previously 1.3 x 0.5 x 1.3 cm in June of 2023. In the left breast at 2:30, 3 cm from the nipple there is a second thinner oval circumscribed mass measuring 0.9 x 0.2 x 0.7 cm, previously 1.3 x 0.2 x 0.7 cm. In the left breast at 3 o'clock, 3 cm from the nipple  there is an oval hypoechoic mass measuring 0.6 x 0.4 x 0.5 cm, previously 0.8 x 0.7 x 0.7 cm. Other scattered benign-appearing cysts are seen in the upper-outer left breast. IMPRESSION: 1.  The multiple likely benign masses in the left breast are stable. 2.  No evidence of malignancy in the bilateral breasts. RECOMMENDATION: Bilateral diagnostic mammogram and left breast ultrasound in 1 year. I have discussed the findings and recommendations with the patient. If applicable, a reminder letter will be sent to the patient regarding the next appointment. BI-RADS CATEGORY  3: Probably benign. Electronically Signed   By: Rosaline Collet M.D.   On: 01/30/2023 16:14   MS DIGITAL DIAG TOMO UNI LEFT Result Date: 06/09/2022 CLINICAL DATA:  Follow-up of probably benign left breast masses. EXAM: DIGITAL DIAGNOSTIC UNILATERAL LEFT MAMMOGRAM WITH TOMOSYNTHESIS; ULTRASOUND LEFT BREAST LIMITED TECHNIQUE: Left digital diagnostic mammography and breast tomosynthesis was performed.; Targeted ultrasound examination of the left breast was performed. COMPARISON:  Previous exam(s). ACR Breast Density Category c: The breast tissue is heterogeneously dense, which may obscure small masses. FINDINGS: Mammographically, there are no new suspicious masses, areas of architectural distortion or microcalcifications in the left breast. Stable focal asymmetry in the left breast upper outer quadrant, middle depth. Targeted left breast ultrasound is performed and demonstrates stable hypoechoic circumscribed benign-appearing mass at 2:30 o'clock 3 cm from the nipple measuring 1.1 x 0.3 x 1.1 cm. There is an adjacent benign-appearing hypoechoic circumscribed mass measuring 0.7 x 0.2 x 0.9 cm. In the left breast 2:30 o'clock 3 cm from nipple there is a benign cyst measuring 0.5 x 0.7 x 0.5 cm. IMPRESSION: Stable benign-appearing left breast masses. RECOMMENDATION:  Bilateral diagnostic mammogram and focused left breast ultrasound in 6 months. I have  discussed the findings and recommendations with the patient. If applicable, a reminder letter will be sent to the patient regarding the next appointment. BI-RADS CATEGORY  3: Probably benign. Electronically Signed   By: Dobrinka  Dimitrova M.D.   On: 06/09/2022 16:13  MS DIGITAL DIAG TOMO UNI LEFT Result Date: 11/24/2021 CLINICAL DATA:  Callback for LEFT breast masses from baseline mammogram. EXAM: DIGITAL DIAGNOSTIC UNILATERAL LEFT MAMMOGRAM WITH TOMOSYNTHESIS AND CAD; ULTRASOUND LEFT BREAST LIMITED TECHNIQUE: Left digital diagnostic mammography and breast tomosynthesis was performed. The images were evaluated with computer-aided detection.; Targeted ultrasound examination of the left breast was performed. COMPARISON:  Previous exam(s). ACR Breast Density Category c: The breast tissue is heterogeneously dense, which may obscure small masses. FINDINGS: Spot compression tomosynthesis views demonstrate persistence of several oval circumscribed masses in the LEFT outer breast at anterior to middle depth. On physical exam, no suspicious mass is appreciated. Targeted ultrasound was performed of the LEFT outer breast. At 3 o'clock 3 cm from the nipple, there is an oval circumscribed anechoic mass with posterior acoustic enhancement. It measures 7 x 7 x 8 mm and is consistent with a benign simple cyst. This corresponds to 1 of the sites of screening mammographic concern. Adjacent at 2:30 3 cm from the nipple, there are 2 adjacent oval circumscribed hypoechoic masses. One spans 13 by 13 x 5 mm while the second spans 7 x 3 by 13 mm. Targeted ultrasound was performed of the LEFT axilla. No suspicious axillary lymph nodes are visualized. IMPRESSION: 1. There are 2 adjacent probably benign masses in the LEFT breast at 2:30 3 cm from the nipple. These may reflect benign complicated cysts versus benign fibroadenomas. Recommend follow-up mammogram and ultrasound in 6 months. This will establish 6 months of definitive stability  from baseline mammogram. 2. There is a benign cyst at the additional site of screening mammographic concern. RECOMMENDATION: LEFT diagnostic mammogram with ultrasound in 6 months. I have discussed the findings and recommendations with the patient in Spanish with the assistance of a Spanish interpreter. If applicable, a reminder letter will be sent to the patient regarding the next appointment. BI-RADS CATEGORY  3: Probably benign. Electronically Signed   By: Corean Salter M.D.   On: 11/24/2021 16:04  MS DIGITAL SCREENING TOMO BILATERAL Result Date: 11/03/2021 CLINICAL DATA:  Screening. EXAM: DIGITAL SCREENING BILATERAL MAMMOGRAM WITH TOMOSYNTHESIS AND CAD TECHNIQUE: Bilateral screening digital craniocaudal and mediolateral oblique mammograms were obtained. Bilateral screening digital breast tomosynthesis was performed. The images were evaluated with computer-aided detection. COMPARISON:  None. ACR Breast Density Category c: The breast tissue is heterogeneously dense, which may obscure small masses. FINDINGS: In the left breast, a possible mass warrants further evaluation. In the right breast, no findings suspicious for malignancy. IMPRESSION: Further evaluation is suggested for a possible mass in the left breast. RECOMMENDATION: Diagnostic mammogram and possibly ultrasound of the left breast. (Code:FI-L-83M) The patient will be contacted regarding the findings, and additional imaging will be scheduled. BI-RADS CATEGORY  0: Incomplete. Need additional imaging evaluation and/or prior mammograms for comparison. Electronically Signed   By: Delon Music M.D.   On: 11/03/2021 16:40   Pelvic/Bimanual Pap is not indicated today per BCCCP guidelines.   Smoking History: Patient has never smoked.   Patient Navigation: Patient education provided. Access to services provided for patient through [facility] program. Spanish interpreter Alan Been from [facility] provided.    Breast and Cervical  Cancer Risk Assessment: Patient does not have family history of breast cancer, known genetic mutations, or radiation treatment to the chest before age 55. Patient does not have history of cervical dysplasia, immunocompromised, or DES exposure in-utero.  Risk Scores as of Encounter on 02/12/2024     [place] as of 12/20/2022           5-year 0.51%   Lifetime 7.14%   This patient is [place]/Latina but has no documented birth country, so the [place] model used data from [place] patients to calculate their risk score. Document a birth country in the Demographics activity for a more accurate score.         Last calculated by Rogerio Tempie SQUIBB, LPN on 2/83/7975 at 12:51 PM        A: BCCCP exam without pap smear No complaints.  P: Referred patient to the [facility] for a diagnostic mammogram. Appointment scheduled Monday, February 12, 2024 at 1340.  Driscilla Wanda SQUIBB, RN 02/12/2024 12:29 PM

## 2024-05-01 ENCOUNTER — Telehealth: Payer: Self-pay | Admitting: Family Medicine

## 2024-05-01 DIAGNOSIS — Z3009 Encounter for other general counseling and advice on contraception: Secondary | ICD-10-CM

## 2024-05-02 MED ORDER — NORETHINDRONE 0.35 MG PO TABS: 1.0000 | ORAL_TABLET | Freq: Every day | ORAL | 1 refills | Status: DC

## 2024-05-02 NOTE — Telephone Encounter (Signed)
 One month supply with one refill of Micronor  on file sent to pharmacy.   Dorothyann Helling, MD 05/02/24  6:11 PM

## 2024-05-06 ENCOUNTER — Telehealth: Payer: Self-pay | Admitting: Family Medicine

## 2024-05-07 ENCOUNTER — Ambulatory Visit: Payer: Self-pay

## 2024-05-07 NOTE — Telephone Encounter (Signed)
 See log Larraine JONELLE Novak, RN

## 2024-07-04 ENCOUNTER — Ambulatory Visit: Payer: Self-pay

## 2024-07-04 VITALS — BP 125/79 | Wt 179.5 lb

## 2024-07-04 DIAGNOSIS — Z3041 Encounter for surveillance of contraceptive pills: Secondary | ICD-10-CM

## 2024-07-04 DIAGNOSIS — Z3009 Encounter for other general counseling and advice on contraception: Secondary | ICD-10-CM

## 2024-07-04 DIAGNOSIS — Z30011 Encounter for initial prescription of contraceptive pills: Secondary | ICD-10-CM

## 2024-07-04 MED ORDER — NORETHINDRONE 0.35 MG PO TABS: 1.0000 | ORAL_TABLET | Freq: Every day | ORAL | Status: DC

## 2024-07-04 NOTE — Progress Notes (Signed)
 In nurse clinic for OCP refill.  2 pills left in last pack Not missed any doses LMP 06/26/24 Last sex 06/25/24 Voices no concerns. Exhausted refills. Pt is scheduled for Annual PE 07/09/24 @4pm . Consulted with H.Middleton, FNP given 1 time order for Micronor . In house Spanish interpreter present  M. [place] during visit.  The patient was dispensed  Micronor  (Jencycla ) #1 packs today. I provided counseling today regarding the medication. We discussed the medication, the side effects and when to call clinic. Patient given the opportunity to ask questions. Questions answered.

## 2024-07-09 ENCOUNTER — Ambulatory Visit: Payer: Self-pay | Admitting: Family Medicine

## 2024-07-09 VITALS — BP 132/87 | HR 93 | Wt 178.0 lb

## 2024-07-09 DIAGNOSIS — Z3009 Encounter for other general counseling and advice on contraception: Secondary | ICD-10-CM | POA: Insufficient documentation

## 2024-07-09 DIAGNOSIS — Z30011 Encounter for initial prescription of contraceptive pills: Secondary | ICD-10-CM

## 2024-07-09 MED ORDER — NORETHINDRONE 0.35 MG PO TABS: 1.0000 | ORAL_TABLET | Freq: Every day | ORAL | 13 refills | Status: DC

## 2024-07-09 MED ORDER — NORETHINDRONE 0.35 MG PO TABS: 1.0000 | ORAL_TABLET | Freq: Every day | ORAL | Status: AC

## 2024-07-09 NOTE — Progress Notes (Signed)
 Pt is here for PE/BC refill. The patient was dispensed #12 packs of MICRONOR . I provided counseling regarding the pills, the side effects and when to call clinic Opportunity given to pt to ask questions for any clarifications. Questions Answered.FP card given Wilkie Drought, RN.

## 2024-07-09 NOTE — Assessment & Plan Note (Signed)
 No complaints, consistent with her micronor . Would like to continue. Will dispense one year's worth of micronor  today. Patient to return in one year or sooner if she has concerns.

## 2024-07-09 NOTE — Patient Instructions (Addendum)
 SABRA
